# Patient Record
Sex: Male | Born: 1960 | Race: White | Hispanic: No | Marital: Single | Smoking: Former smoker
Health system: Southern US, Community
[De-identification: ages and names within clinical notes are randomized; demographics above are authoritative.]

## PROBLEM LIST (undated history)

## (undated) DIAGNOSIS — B182 Chronic viral hepatitis C: Secondary | ICD-10-CM

## (undated) DIAGNOSIS — C801 Malignant (primary) neoplasm, unspecified: Secondary | ICD-10-CM

## (undated) DIAGNOSIS — T1491XA Suicide attempt, initial encounter: Secondary | ICD-10-CM

## (undated) DIAGNOSIS — F200 Paranoid schizophrenia: Secondary | ICD-10-CM

## (undated) DIAGNOSIS — F319 Bipolar disorder, unspecified: Secondary | ICD-10-CM

## (undated) DIAGNOSIS — C229 Malignant neoplasm of liver, not specified as primary or secondary: Secondary | ICD-10-CM

## (undated) DIAGNOSIS — K7460 Unspecified cirrhosis of liver: Secondary | ICD-10-CM

## (undated) HISTORY — PX: ABDOMINAL SURGERY: SHX537

---

## 2014-02-21 ENCOUNTER — Ambulatory Visit: Payer: Self-pay | Admitting: Gastroenterology

## 2014-05-10 ENCOUNTER — Emergency Department: Payer: Self-pay | Admitting: Emergency Medicine

## 2014-05-10 LAB — COMPREHENSIVE METABOLIC PANEL
ALT: 103 U/L — AB
Albumin: 3.4 g/dL (ref 3.4–5.0)
Alkaline Phosphatase: 77 U/L
Anion Gap: 12 (ref 7–16)
BUN: 11 mg/dL (ref 7–18)
Bilirubin,Total: 1.8 mg/dL — ABNORMAL HIGH (ref 0.2–1.0)
CALCIUM: 8.4 mg/dL — AB (ref 8.5–10.1)
CREATININE: 0.85 mg/dL (ref 0.60–1.30)
Chloride: 107 mmol/L (ref 98–107)
Co2: 19 mmol/L — ABNORMAL LOW (ref 21–32)
EGFR (Non-African Amer.): >60
GLUCOSE: 103 mg/dL — AB (ref 65–99)
OSMOLALITY: 275 (ref 275–301)
Potassium: 3.3 mmol/L — ABNORMAL LOW (ref 3.5–5.1)
SGOT(AST): 218 U/L — ABNORMAL HIGH (ref 15–37)
SODIUM: 138 mmol/L (ref 136–145)
Total Protein: 7.4 g/dL (ref 6.4–8.2)

## 2014-05-10 LAB — URINALYSIS, COMPLETE
BACTERIA: NONE SEEN
Bilirubin,UR: NEGATIVE
Glucose,UR: NEGATIVE mg/dL (ref 0–75)
Hyaline Cast: 31
LEUKOCYTE ESTERASE: NEGATIVE
NITRITE: NEGATIVE
Ph: 6 (ref 4.5–8.0)
Protein: 100
RBC,UR: 5 /HPF (ref 0–5)
SQUAMOUS EPITHELIAL: NONE SEEN
Specific Gravity: 1.029 (ref 1.003–1.030)
WBC UR: 3 /HPF (ref 0–5)

## 2014-05-10 LAB — DRUG SCREEN, URINE
Amphetamines, Ur Screen: NEGATIVE (ref ?–1000)
BENZODIAZEPINE, UR SCRN: NEGATIVE (ref ?–200)
Barbiturates, Ur Screen: NEGATIVE (ref ?–200)
COCAINE METABOLITE, UR ~~LOC~~: NEGATIVE (ref ?–300)
Cannabinoid 50 Ng, Ur ~~LOC~~: NEGATIVE (ref ?–50)
MDMA (Ecstasy)Ur Screen: NEGATIVE (ref ?–500)
METHADONE, UR SCREEN: NEGATIVE (ref ?–300)
OPIATE, UR SCREEN: NEGATIVE (ref ?–300)
Phencyclidine (PCP) Ur S: NEGATIVE (ref ?–25)
Tricyclic, Ur Screen: NEGATIVE (ref ?–1000)

## 2014-05-10 LAB — CBC
HCT: 37.2 % — ABNORMAL LOW (ref 40.0–52.0)
HGB: 12.2 g/dL — ABNORMAL LOW (ref 13.0–18.0)
MCH: 31 pg (ref 26.0–34.0)
MCHC: 32.7 g/dL (ref 32.0–36.0)
MCV: 95 fL (ref 80–100)
Platelet: 67 10*3/uL — ABNORMAL LOW (ref 150–440)
RBC: 3.92 10*6/uL — ABNORMAL LOW (ref 4.40–5.90)
RDW: 16.9 % — AB (ref 11.5–14.5)
WBC: 7 10*3/uL (ref 3.8–10.6)

## 2014-05-10 LAB — ACETAMINOPHEN LEVEL: Acetaminophen: <2 ug/mL

## 2014-05-10 LAB — ETHANOL: Ethanol: <3 mg/dL

## 2014-05-10 LAB — TROPONIN I: Troponin-I: <0.02 ng/mL

## 2014-05-10 LAB — SALICYLATE LEVEL: Salicylates, Serum: <1.7 mg/dL

## 2014-05-10 LAB — LIPASE, BLOOD: Lipase: 330 U/L (ref 73–393)

## 2014-05-10 LAB — AMMONIA: Ammonia, Plasma: 38 mcmol/L — ABNORMAL HIGH (ref 11–32)

## 2014-12-14 NOTE — Consult Note (Signed)
PATIENT NAME:  Aaron Jacobson, Aaron Jacobson MR#:  [id number] DATE OF BIRTH:  Jul 19, 1961  DATE OF CONSULTATION:  05/10/2014  REFERRING PHYSICIAN:   CONSULTING PHYSICIAN:  Unique Sillas K. Naftoli Penny, MD  IDENTIFYING INFORMATION: The patient is a 54 year old white male with long history of schizophrenia. The patient is not employed, not married, and has been living at [facility] group home for quite some time. The patient was brought because he has been noncompliant with medications. In addition, according to information obtained, he laughed at his roommate inside the room because he had arguments with him. In addition, the patient reports that the roommate was upsetting him because he constantly talks about his pain and constantly talks about his p.r.n. medications and really bothers him and he was very upset and angry and behavior was of big concern. The patient reports that he has been off of his medication, which is Zyprexa 10 mg, which was discontinued by his doctor, who is Dr. Jacqualine Code at [facility], because the patient is having hepatitis C and until he is stabilized he will not give the medication. The patient reports that he last had the medication on January 23, 2014. According to information obtained from intake, this was wrong, as given by the PCP.   MENTAL STATUS EXAMINATION: The patient was seen in the hallway, on the 18th. The patient is alert and oriented with prompting and help and reported he did not know the exact date because he does not go to work. Admits feeling upset and angry at his roommate who constantly bothers him about his problems. He does not feel anything wrong in having locked himself up with the roommate. He is irritable and angry and frustrated. Probably he is internally occupied. He is disheveled in appearance with poor grooming and bad odor and malodorous. Denies active suicidal or homicidal ideas, but behavior is unpredictable and could be dangerous to himself and others. Insight and judgment guarded.   IMPRESSION:  Schizophrenia, chronic, paranoid, in exacerbation.  PLAN: He will be admitted to inpatient psychiatry for closer observation, evaluation and help. He is started on Zyprexa 10 mg p.o. at bedtime and medication will be monitored.  ____________________________ Wallace Cullens. Franchot Mimes, MD skc:sb D: 05/10/2014 15:12:16 ET T: 05/10/2014 16:00:14 ET JOB#: [id number]  cc: Arlyn Leak K. Franchot Mimes, MD, <Dictator> Dewain Penning MD ELECTRONICALLY SIGNED 05/11/2014 11:13

## 2014-12-14 NOTE — Consult Note (Signed)
PATIENT NAME:  Aaron Jacobson, Aaron Jacobson MR#:  [id number] DATE OF BIRTH:  [birth year]  DATE OF CONSULTATION:  05/13/2014  REFERRING PHYSICIAN:   CONSULTING PHYSICIAN:  Gonzella Lex, MD  IDENTIFYING INFORMATION AND REASON FOR CONSULT: A 54 year old male with a history of schizophrenia, who came to the Emergency Room after being aggressive at his group home. He was started on treatment 2 days ago.   CHIEF COMPLAINT: "Today I'm feeling so much better."   HISTORY OF PRESENT ILLNESS: Since Friday, he was started on his Zyprexa 10 mg at night. He has been compliant with the medicine. The patient says his mood is feeling better. He is not feeling irritable. He is no longer feeling paranoid. Does not have any irritability or urge to hit someone. He does not have any new physical complaints since then.   REVIEW OF SYSTEMS: A full 9-point review of systems negative. Denies paranoia, denies hallucinations. Denies suicidal or homicidal ideation.   MENTAL STATUS EXAMINATION: Neatly groomed gentleman who looks his stated age, cooperative with the interview. Good eye contact. Normal psychomotor activity. Speech, normal rate, tone and volume. Affect euthymic, pleasant, reactive. Mood stated as okay. Thoughts are lucid without any loosening of associations. No evidence of delusions. Denies auditory or visual hallucinations. Denies suicidal or homicidal ideation. Shows improved judgment and insight. Alert and oriented x 4. Short term memory intact, 3/3 objects immediately and at 2 minutes. Long-term memory intact.   LABORATORY RESULTS: On admission, he did have a drug screen that was negative. He had a low platelet count at 67, low hematocrit and hemoglobin. Elevated ALT at 103, elevated AST at 218, low calcium, elevated bilirubin, elevated ammonia. No further laboratories checked.   ASSESSMENT: A 54 year old man who has hepatitis C and was taken off, by his account, his medicine, and decompensated into paranoia and aggression.  The Zyprexa appears to be a relatively safe medication despite his hepatitis and worthwhile to continue taking. There is a plan from what he says in place for him to get definitive treatment for his hepatitis C soon. Meanwhile, he is tolerating the Zyprexa well and it seems to have helped a great deal. No longer violent or threatening. No longer meets commitment criteria.   TREATMENT PLAN: Discharge back to his group home on his current medication, Zyprexa 10 mg at night. Prescription will be given. Follow up with Dr. Jacqualine Code.   DIAGNOSIS, PRINCIPAL AND PRIMARY:  AXIS I: Schizophrenia.   SECONDARY DIAGNOSES:  AXIS I: No further.  AXIS II: No diagnosis.  AXIS III: Hepatitis C, recent injury to right forearm.   ____________________________ Gonzella Lex, MD jtc:JT D: 05/13/2014 12:21:29 ET T: 05/13/2014 12:46:31 ET JOB#: [id number]  cc: Gonzella Lex, MD, <Dictator> Gonzella Lex MD ELECTRONICALLY SIGNED 05/16/2014 0:11

## 2015-05-17 ENCOUNTER — Emergency Department
Admission: EM | Admit: 2015-05-17 | Discharge: 2015-05-18 | Disposition: A | Discharge status: Home / Self Care | Payer: Medicaid Other | Attending: Emergency Medicine | Admitting: Emergency Medicine

## 2015-05-17 ENCOUNTER — Encounter: Payer: Self-pay | Admitting: Emergency Medicine

## 2015-05-17 DIAGNOSIS — D696 Thrombocytopenia, unspecified: Secondary | ICD-10-CM | POA: Diagnosis not present

## 2015-05-17 DIAGNOSIS — R2240 Localized swelling, mass and lump, unspecified lower limb: Secondary | ICD-10-CM | POA: Diagnosis present

## 2015-05-17 DIAGNOSIS — Z87891 Personal history of nicotine dependence: Secondary | ICD-10-CM | POA: Diagnosis not present

## 2015-05-17 DIAGNOSIS — Z79899 Other long term (current) drug therapy: Secondary | ICD-10-CM | POA: Diagnosis not present

## 2015-05-17 DIAGNOSIS — I868 Varicose veins of other specified sites: Secondary | ICD-10-CM | POA: Insufficient documentation

## 2015-05-17 DIAGNOSIS — I83899 Varicose veins of unspecified lower extremities with other complications: Secondary | ICD-10-CM

## 2015-05-17 DIAGNOSIS — N501 Vascular disorders of male genital organs: Secondary | ICD-10-CM | POA: Insufficient documentation

## 2015-05-17 HISTORY — DX: Unspecified cirrhosis of liver: K74.60

## 2015-05-17 HISTORY — DX: Chronic viral hepatitis C: B18.2

## 2015-05-17 HISTORY — DX: Bipolar disorder, unspecified: F31.9

## 2015-05-17 MED ORDER — SILVER NITRATE-POT NITRATE 75-25 % EX MISC
1.0000 | Freq: Once | CUTANEOUS | Status: AC
  Administered 2015-05-18: 1 via TOPICAL
  Filled 2015-05-17: qty 1

## 2015-05-17 NOTE — ED Provider Notes (Signed)
[facility] Emergency Department Provider Note  ____________________________________________  Time seen: Approximately 11:13 PM  I have reviewed the triage vital signs and the nursing notes.   HISTORY  Chief Complaint Groin Swelling    HPI Aaron Jacobson is a 54 y.o. male who presents to the ED from group home via EMS for a chief complaint of bleeding from his scrotum. Patient has a history of hepatitis C and cirrhosis with prior history of same. States he has "varicose looking veins in the scrotum and they started to bleed tonight". Patient went into the shower and bleeding became heavier. Patient placed a towel against the area and wore a diaper over the towel. Denies other complaints. Denies recent fever, chills, chest pain, shortness of breath, abdominal pain, nausea, vomiting, diarrhea. Denies other free bleeding. Denies use of anticoagulants.   Past Medical History  Diagnosis Date  . Hep C w/ coma, chronic   . Cirrhosis   . Bipolar 1 disorder     There are no active problems to display for this patient.   Past Surgical History  Procedure Laterality Date  . Abdominal surgery      Current Outpatient Rx  Name  Route  Sig  Dispense  Refill  . carvedilol (COREG) 6.25 MG tablet   Oral   Take 6.25 mg by mouth 2 (two) times daily with a meal.         . ibuprofen (ADVIL,MOTRIN) 600 MG tablet   Oral   Take 600 mg by mouth every 6 (six) hours as needed for moderate pain.         Marland Kitchen OLANZapine (ZYPREXA) 10 MG tablet   Oral   Take 10 mg by mouth at bedtime.         Marland Kitchen omeprazole (PRILOSEC) 10 MG capsule   Oral   Take 10 mg by mouth daily.         . ribavirin (COPEGUS) 200 MG tablet   Oral   Take 2 tablets by mouth 2 (two) times daily.         . Sofosbuvir-Velpatasvir 400-100 MG TABS   Oral   Take 1 tablet by mouth daily at 8 pm.           Allergies Review of patient's allergies indicates no known allergies.  History reviewed. No  pertinent family history.  Social History Social History  Substance Use Topics  . Smoking status: Former Research scientist (life sciences)  . Smokeless tobacco: None  . Alcohol Use: No   Former alcohol use  Review of Systems Constitutional: No fever/chills Eyes: No visual changes. ENT: No sore throat. Cardiovascular: Denies chest pain. Respiratory: Denies shortness of breath. Gastrointestinal: No abdominal pain.  No nausea, no vomiting.  No diarrhea.  No constipation. Genitourinary: Positive for scrotal bleeding. Negative for dysuria. Musculoskeletal: Negative for back pain. Skin: Negative for rash. Neurological: Negative for headaches, focal weakness or numbness.  10-point ROS otherwise negative.  ____________________________________________   PHYSICAL EXAM:  VITAL SIGNS: ED Triage Vitals  Enc Vitals Group     BP 05/17/15 2206 116/81 mmHg     Pulse Rate 05/17/15 2206 71     Resp 05/17/15 2206 18     Temp 05/17/15 2206 98.2 F (36.8 C)     Temp Source 05/17/15 2206 Oral     SpO2 05/17/15 2206 97 %     Weight 05/17/15 2206 187 lb (84.823 kg)     Height 05/17/15 2206 6\' 1"  (1.854 m)     Head  Cir --      Peak Flow --      Pain Score 05/17/15 2207 0     Pain Loc --      Pain Edu? --      Excl. in [place]? --     Constitutional: Alert and oriented. Well appearing and in no acute distress. Eyes: Conjunctivae are normal. PERRL. EOMI. Head: Atraumatic. Nose: No congestion/rhinnorhea. Mouth/Throat: Mucous membranes are moist.  Oropharynx non-erythematous. Neck: No stridor.   Cardiovascular: Normal rate, regular rhythm. Grossly normal heart sounds.  Good peripheral circulation. Respiratory: Normal respiratory effort.  No retractions. Lungs CTAB. Gastrointestinal: Soft and nontender. No distention. Ascites noted. No abdominal bruits. No CVA tenderness. Genitourinary: Circumcised male. No active bleeding. There is a tiny abrasion to scrotal sac at the base of the penile shaft associated with tiny  varicose vein. No testicular swelling or tenderness. Strong bilateral cremaster reflexes. Musculoskeletal: No lower extremity tenderness nor edema.  No joint effusions. Neurologic:  Normal speech and language. No gross focal neurologic deficits are appreciated. No gait instability. Skin:  Skin is warm, dry and intact. No rash noted. Psychiatric: Mood and affect are normal. Speech and behavior are normal.  ____________________________________________   LABS (all labs ordered are listed, but only abnormal results are displayed)  Labs Reviewed  CBC WITH DIFFERENTIAL/PLATELET - Abnormal; Notable for the following:    WBC 3.5 (*)    RBC 3.33 (*)    Hemoglobin 11.4 (*)    HCT 33.7 (*)    MCV 101.2 (*)    MCH 34.2 (*)    RDW 18.3 (*)    Platelets 31 (*)    Lymphs Abs 0.8 (*)    All other components within normal limits  COMPREHENSIVE METABOLIC PANEL - Abnormal; Notable for the following:    Chloride 112 (*)    Glucose, Bld 124 (*)    Calcium 8.1 (*)    Total Protein 6.0 (*)    Albumin 2.7 (*)    Total Bilirubin 1.6 (*)    Anion gap 3 (*)    All other components within normal limits  PROTIME-INR - Abnormal; Notable for the following:    Prothrombin Time 17.4 (*)    All other components within normal limits   ____________________________________________  EKG  None ____________________________________________  RADIOLOGY  None ____________________________________________   PROCEDURES  Procedure(s) performed:   Silver nitrate applied to area of abrasion/site of prior bleeding.  Critical Care performed: No  ____________________________________________   INITIAL IMPRESSION / ASSESSMENT AND PLAN / ED COURSE  Pertinent labs & imaging results that were available during my care of the patient were reviewed by me and considered in my medical decision making (see chart for details).  55 year old male with history of cirrhosis with bleeding scrotal varicose vein, now  resolved. Will apply silver nitrate cautery stick. Will check labs including coagulation panels.  ----------------------------------------- 12:08 AM on 05/18/2015 -----------------------------------------  Silver nitrate stick applied. Patient tolerated procedure well. Awaiting lab results.  ----------------------------------------- 12:46 AM on 05/18/2015 -----------------------------------------  Updated patient on laboratory results; thrombocytopenia noted. Platelets were 67 in 04/2014. Reexamined scrotum; no active bleeding. No indication to replace platelets at this time. Advised scrotal elevation. Follow-up with PCP. Strict return precautions given. Patient verbalizes understanding and agrees with plan of care. ____________________________________________   FINAL CLINICAL IMPRESSION(S) / ED DIAGNOSES  Final diagnoses:  Scrotal bleeding  Bleeding from varicose vein, unspecified laterality  Thrombocytopenia      Paulette Blanch, MD 05/18/15 [phone]

## 2015-05-17 NOTE — ED Notes (Signed)
Silver Nitrate Applicators placed at bedside.

## 2015-05-17 NOTE — ED Notes (Signed)
Pt arrived to the ED from group home via EMS for bleeding from the scrotum. Pt reports that he has "varicose looking veins in the scrotum and they started to bleed while showering." Pt is AOx4 in no apparent distress.

## 2015-05-18 LAB — CBC WITH DIFFERENTIAL/PLATELET
Basophils Absolute: 0 10*3/uL (ref 0–0.1)
Basophils Relative: 0 %
Eosinophils Absolute: 0.3 10*3/uL (ref 0–0.7)
HEMATOCRIT: 33.7 % — AB (ref 40.0–52.0)
Hemoglobin: 11.4 g/dL — ABNORMAL LOW (ref 13.0–18.0)
Lymphs Abs: 0.8 10*3/uL — ABNORMAL LOW (ref 1.0–3.6)
MCH: 34.2 pg — AB (ref 26.0–34.0)
MCHC: 33.8 g/dL (ref 32.0–36.0)
MCV: 101.2 fL — AB (ref 80.0–100.0)
MONO ABS: 0.4 10*3/uL (ref 0.2–1.0)
NEUTROS ABS: 2 10*3/uL (ref 1.4–6.5)
Neutrophils Relative %: 57 %
PLATELETS: 31 10*3/uL — AB (ref 150–440)
RBC: 3.33 MIL/uL — ABNORMAL LOW (ref 4.40–5.90)
RDW: 18.3 % — AB (ref 11.5–14.5)
WBC: 3.5 10*3/uL — ABNORMAL LOW (ref 3.8–10.6)

## 2015-05-18 LAB — COMPREHENSIVE METABOLIC PANEL
ALT: 19 U/L (ref 17–63)
ANION GAP: 3 — AB (ref 5–15)
AST: 39 U/L (ref 15–41)
Albumin: 2.7 g/dL — ABNORMAL LOW (ref 3.5–5.0)
Alkaline Phosphatase: 101 U/L (ref 38–126)
BILIRUBIN TOTAL: 1.6 mg/dL — AB (ref 0.3–1.2)
BUN: 7 mg/dL (ref 6–20)
CHLORIDE: 112 mmol/L — AB (ref 101–111)
CO2: 23 mmol/L (ref 22–32)
Calcium: 8.1 mg/dL — ABNORMAL LOW (ref 8.9–10.3)
Creatinine, Ser: 0.7 mg/dL (ref 0.61–1.24)
Glucose, Bld: 124 mg/dL — ABNORMAL HIGH (ref 65–99)
POTASSIUM: 3.8 mmol/L (ref 3.5–5.1)
Sodium: 138 mmol/L (ref 135–145)
TOTAL PROTEIN: 6 g/dL — AB (ref 6.5–8.1)

## 2015-05-18 LAB — PROTIME-INR
INR: 1.4
PROTHROMBIN TIME: 17.4 s — AB (ref 11.4–15.0)

## 2015-05-18 NOTE — Discharge Instructions (Signed)
1. Stay reclined as much as possible and elevate scrotum. 2. Follow-up with your doctor to recheck platelet count. 3. Return to the ER for recurrent or worsening symptoms, persistent vomiting, pain or swelling in testicles, or other concerns.  Bleeding Varicose Veins Varicose veins are veins that have become enlarged and twisted. Valves in the veins help return blood from the leg to the heart. If these valves are damaged, blood flows backwards and backs up into the veins in the leg near the skin. This causes the veins to become larger because of increased pressure within. Sometimes these veins bleed. CAUSES  Factors that can lead to bleeding varicose veins include:  Thinning of the skin that covers the veins. This skin is stretched as the veins enlarge.  Weak and thinning walls of the varicose veins. These thin walls are part of the reason why blood is not flowing normally to the heart.  Having high pressure in the veins. This high pressure occurs because the blood is not flowing freely back up to the heart.  Injury. Even a small injury to a varicose vein can cause bleeding.  Open wounds. A sore may develop near a varicose vein and not heal. This makes bleeding more likely.  Taking medicine that thins the blood. These medicines may include aspirin, anti-inflammatory medicine, and other blood thinners. SYMPTOMS  If bleeding is on the outside surface of the skin, blood can be seen. Sometimes, the bleeding stays under the skin. If this happens, the blue or purple area will spread beyond the vein. This discoloration may be visible. DIAGNOSIS  To decide if you have a bleeding varicose vein, your caregiver may:  Ask about your symptoms. This will include when you first saw bleeding.  Ask about how long you have had varicose veins and if they cause you problems.  Ask about your overall health.  Ask about possible causes, like recent cuts or if the area near the varicose veins was bumped or  injured.  Examine the skin or leg that concerns you. Your caregiver will probably feel the veins.  Order imaging tests. These create detailed pictures of the veins. TREATMENT  The first goal of treating bleeding varicose veins is to stop the bleeding. Then, the aim is to keep any bleeding from happening again. Treatment will depend on the cause of the bleeding and how bad it is. Ask your caregiver about what would be best for you. Options include:  Raising (elevating) your leg. Lie down with your leg propped up on a pillow or cushion. Your foot should be above your heart.  Applying pressure to the spot that is bleeding. The bleeding should stop in a short time.  Wearing elastic stocking that "compress" your legs (compression stockings). An elastic bandage may do the same thing.  Applying an antibiotic cream on sores that are not healing.  Surgically removing or closing off the bleeding varicose veins. HOME CARE INSTRUCTIONS   Apply any creams that your caregiver prescribed. Follow the directions carefully.  Wear compression stockings or any special wraps that were prescribed. Make sure you know:  If you should wear them every day.  How long you should wear them.  If veins were removed or closed, a bandage (dressing) will probably cover the area. Make sure you know:  How often the dressing should be changed.  Whether the area can get wet.  When you can leave the skin uncovered.  Check your skin every day. Look for new sores and signs of  bleeding.  To prevent future bleeding:  Use extra care in situations where you could cut your legs. Shaving, for example, or working outside in the garden.  Try to keep your legs elevated as much as possible. Lie down when you can. SEEK MEDICAL CARE IF:   You have any questions about how to wear compression stockings or elastic bandages.  Your veins continue to bleed.  Sores develop near your varicose veins.  You have a sore that does  not heal or gets bigger.  Pain increases in your leg.  The area around a varicose vein becomes warm, red, or tender to the touch.  You notice a yellowish fluid that smells bad coming from a spot where there was bleeding.  You develop a fever of more than 100.5 F (38.1 C). SEEK IMMEDIATE MEDICAL CARE IF:   You develop a fever of more than 102 F (38.9 C). Document Released: 12/26/2008 Document Revised: 11/01/2011 Document Reviewed: 12/11/2013 [facility] Patient Information 2015 [place], [place]. This information is not intended to replace advice given to you by your health care provider. Make sure you discuss any questions you have with your health care provider.  Thrombocytopenia Thrombocytopenia is a condition in which there is an abnormally small number of platelets in your blood. Platelets are also called thrombocytes. Platelets are needed for blood clotting. CAUSES Thrombocytopenia is caused by:   Decreased production of platelets. This can be caused by:  Aplastic anemia in which your bone marrow quits making blood cells.  Cancer in the bone marrow.  Use of certain medicines, including chemotherapy.  Infection in the bone marrow.  Heavy alcohol consumption.  Increased destruction of platelets. This can be caused by:  Certain immune diseases.  Use of certain drugs.  Certain blood clotting disorders.  Certain inherited disorders.  Certain bleeding disorders.  Pregnancy.  Having an enlarged spleen (hypersplenism). In hypersplenism, the spleen gathers up platelets from circulation. This means the platelets are not available to help with blood clotting. The spleen can enlarge due to cirrhosis or other conditions. SYMPTOMS  The symptoms of thrombocytopenia are side effects of poor blood clotting. Some of these are:  Abnormal bleeding.  Nosebleeds.  Heavy menstrual periods.  Blood in the urine or stools.  Purpura. This is a purplish discoloration in the skin  produced by small bleeding vessels near the surface of the skin.  Bruising.  A rash that may be petechial. This looks like pinpoint, purplish-red spots on the skin and mucous membranes. It is caused by bleeding from small blood vessels (capillaries). DIAGNOSIS  Your caregiver will make this diagnosis based on your exam and blood tests. Sometimes, a bone marrow study is done to look for the original cells (megakaryocytes) that make platelets. TREATMENT  Treatment depends on the cause of the condition.  Medicines may be given to help protect your platelets from being destroyed.  In some cases, a replacement (transfusion) of platelets may be required to stop or prevent bleeding.  Sometimes, the spleen must be surgically removed. HOME CARE INSTRUCTIONS   Check the skin and linings inside your mouth for bruising or bleeding as directed by your caregiver.  Check your sputum, urine, and stool for blood as directed by your caregiver.  Do not return to any activities that could cause bumps or bruises until your caregiver says it is okay.  Take extra care not to cut yourself when shaving or when using scissors, needles, knives, and other tools.  Take extra care not to burn  yourself when ironing or cooking.  Ask your caregiver if it is okay for you to drink alcohol.  Only take over-the-counter or prescription medicines as directed by your caregiver.  Notify all your caregivers, including dentists and eye doctors, about your condition. SEEK IMMEDIATE MEDICAL CARE IF:   You develop active bleeding from anywhere in your body.  You develop unexplained bruising or bleeding.  You have blood in your sputum, urine, or stool. MAKE SURE YOU:  Understand these instructions.  Will watch your condition.  Will get help right away if you are not doing well or get worse. Document Released: 08/09/2005 Document Revised: 11/01/2011 Document Reviewed: 06/11/2011 [facility] Patient Information 2015  [place] , [place]. This information is not intended to replace advice given to you by your health care provider. Make sure you discuss any questions you have with your health care provider.

## 2016-04-05 ENCOUNTER — Inpatient Hospital Stay
Admission: EM | Admit: 2016-04-05 | Discharge: 2016-04-07 | DRG: 918 | Disposition: A | Discharge status: Rehabilitation Facility | Payer: Medicaid Other | Attending: Internal Medicine | Admitting: Internal Medicine

## 2016-04-05 DIAGNOSIS — R9431 Abnormal electrocardiogram [ECG] [EKG]: Secondary | ICD-10-CM

## 2016-04-05 DIAGNOSIS — T447X2A Poisoning by beta-adrenoreceptor antagonists, intentional self-harm, initial encounter: Principal | ICD-10-CM | POA: Diagnosis present

## 2016-04-05 DIAGNOSIS — B182 Chronic viral hepatitis C: Secondary | ICD-10-CM | POA: Diagnosis present

## 2016-04-05 DIAGNOSIS — Z9889 Other specified postprocedural states: Secondary | ICD-10-CM

## 2016-04-05 DIAGNOSIS — Z79899 Other long term (current) drug therapy: Secondary | ICD-10-CM | POA: Diagnosis not present

## 2016-04-05 DIAGNOSIS — I77819 Aortic ectasia, unspecified site: Secondary | ICD-10-CM | POA: Diagnosis present

## 2016-04-05 DIAGNOSIS — F32A Depression, unspecified: Secondary | ICD-10-CM | POA: Diagnosis present

## 2016-04-05 DIAGNOSIS — E876 Hypokalemia: Secondary | ICD-10-CM | POA: Diagnosis present

## 2016-04-05 DIAGNOSIS — T1491XA Suicide attempt, initial encounter: Secondary | ICD-10-CM

## 2016-04-05 DIAGNOSIS — R601 Generalized edema: Secondary | ICD-10-CM | POA: Diagnosis present

## 2016-04-05 DIAGNOSIS — F329 Major depressive disorder, single episode, unspecified: Secondary | ICD-10-CM | POA: Diagnosis not present

## 2016-04-05 DIAGNOSIS — T50902A Poisoning by unspecified drugs, medicaments and biological substances, intentional self-harm, initial encounter: Secondary | ICD-10-CM

## 2016-04-05 DIAGNOSIS — Z720 Tobacco use: Secondary | ICD-10-CM

## 2016-04-05 DIAGNOSIS — K766 Portal hypertension: Secondary | ICD-10-CM | POA: Diagnosis present

## 2016-04-05 DIAGNOSIS — D61818 Other pancytopenia: Secondary | ICD-10-CM | POA: Diagnosis present

## 2016-04-05 DIAGNOSIS — I8500 Esophageal varices without bleeding: Secondary | ICD-10-CM | POA: Diagnosis present

## 2016-04-05 DIAGNOSIS — R001 Bradycardia, unspecified: Secondary | ICD-10-CM | POA: Diagnosis present

## 2016-04-05 DIAGNOSIS — F319 Bipolar disorder, unspecified: Secondary | ICD-10-CM | POA: Diagnosis present

## 2016-04-05 DIAGNOSIS — R579 Shock, unspecified: Secondary | ICD-10-CM

## 2016-04-05 DIAGNOSIS — I959 Hypotension, unspecified: Secondary | ICD-10-CM | POA: Diagnosis present

## 2016-04-05 DIAGNOSIS — F200 Paranoid schizophrenia: Secondary | ICD-10-CM

## 2016-04-05 DIAGNOSIS — K7460 Unspecified cirrhosis of liver: Secondary | ICD-10-CM | POA: Diagnosis present

## 2016-04-05 DIAGNOSIS — C229 Malignant neoplasm of liver, not specified as primary or secondary: Secondary | ICD-10-CM | POA: Diagnosis present

## 2016-04-05 DIAGNOSIS — I10 Essential (primary) hypertension: Secondary | ICD-10-CM | POA: Diagnosis present

## 2016-04-05 DIAGNOSIS — B1920 Unspecified viral hepatitis C without hepatic coma: Secondary | ICD-10-CM | POA: Diagnosis not present

## 2016-04-05 DIAGNOSIS — R188 Other ascites: Secondary | ICD-10-CM

## 2016-04-05 DIAGNOSIS — C220 Liver cell carcinoma: Secondary | ICD-10-CM | POA: Diagnosis present

## 2016-04-05 DIAGNOSIS — I9589 Other hypotension: Secondary | ICD-10-CM | POA: Diagnosis not present

## 2016-04-05 HISTORY — DX: Suicide attempt, initial encounter: T14.91XA

## 2016-04-05 HISTORY — DX: Paranoid schizophrenia: F20.0

## 2016-04-05 LAB — CBC
HCT: 28.2 % — ABNORMAL LOW (ref 40.0–52.0)
HEMOGLOBIN: 9.4 g/dL — AB (ref 13.0–18.0)
MCH: 30.7 pg (ref 26.0–34.0)
MCHC: 33.3 g/dL (ref 32.0–36.0)
MCV: 92.1 fL (ref 80.0–100.0)
Platelets: 33 10*3/uL — ABNORMAL LOW (ref 150–440)
RBC: 3.06 MIL/uL — AB (ref 4.40–5.90)
RDW: 18.8 % — ABNORMAL HIGH (ref 11.5–14.5)
WBC: 2.2 10*3/uL — AB (ref 3.8–10.6)

## 2016-04-05 LAB — COMPREHENSIVE METABOLIC PANEL
ALK PHOS: 96 U/L (ref 38–126)
ALT: 19 U/L (ref 17–63)
AST: 40 U/L (ref 15–41)
Albumin: 2.9 g/dL — ABNORMAL LOW (ref 3.5–5.0)
Anion gap: 5 (ref 5–15)
BUN: 7 mg/dL (ref 6–20)
CALCIUM: 8 mg/dL — AB (ref 8.9–10.3)
CO2: 21 mmol/L — AB (ref 22–32)
CREATININE: 0.58 mg/dL — AB (ref 0.61–1.24)
Chloride: 109 mmol/L (ref 101–111)
Glucose, Bld: 131 mg/dL — ABNORMAL HIGH (ref 65–99)
Potassium: 3.2 mmol/L — ABNORMAL LOW (ref 3.5–5.1)
SODIUM: 135 mmol/L (ref 135–145)
Total Bilirubin: 0.8 mg/dL (ref 0.3–1.2)
Total Protein: 5.7 g/dL — ABNORMAL LOW (ref 6.5–8.1)

## 2016-04-05 LAB — TROPONIN I: Troponin I: <0.03 ng/mL (ref <0.03)

## 2016-04-05 MED ORDER — CHARCOAL ACTIVATED PO LIQD
50.0000 g | Freq: Once | ORAL | Status: AC
  Administered 2016-04-05: 50 g via ORAL
  Filled 2016-04-05: qty 240

## 2016-04-05 MED ORDER — SODIUM CHLORIDE 0.9 % IV BOLUS (SEPSIS)
1000.0000 mL | Freq: Once | INTRAVENOUS | Status: AC
  Administered 2016-04-05: 1000 mL via INTRAVENOUS

## 2016-04-05 MED ORDER — GLUCAGON HCL RDNA (DIAGNOSTIC) 1 MG IJ SOLR
1.0000 mg | Freq: Once | INTRAMUSCULAR | Status: AC
  Administered 2016-04-05: 1 mg via INTRAVENOUS
  Filled 2016-04-05: qty 1

## 2016-04-05 MED ORDER — ONDANSETRON HCL 4 MG/2ML IJ SOLN
4.0000 mg | Freq: Once | INTRAMUSCULAR | Status: AC
  Administered 2016-04-05: 4 mg via INTRAVENOUS
  Filled 2016-04-05: qty 2

## 2016-04-05 MED ORDER — CHARCOAL ACTIVATED PO LIQD
ORAL | Status: AC
  Filled 2016-04-05: qty 240

## 2016-04-05 MED ORDER — SODIUM CHLORIDE 0.9 % IV SOLN
1.0000 g | Freq: Once | INTRAVENOUS | Status: AC
  Administered 2016-04-06: 1 g via INTRAVENOUS
  Filled 2016-04-05: qty 10

## 2016-04-05 NOTE — ED Provider Notes (Signed)
[facility] Emergency Department Provider Note    First MD Initiated Contact with Patient 04/05/16 2222     (approximate)  I have reviewed the triage vital signs and the nursing notes.   HISTORY  Chief Complaint Drug Overdose and Depression    HPI Aaron Jacobson is a 55 y.o. male history of bipolar disorder as well as cirrhosis presents with intentional ingestion of over 40 - 6.25 mg carvedilol pills roughly 1 hour prior to arrival. There is a slightly diagnosed with cancer and given his cirrhosis she's been very depressed and no longer wanted to live. States even thinking about killing himself quite some time. Said he isn't taking his blood pressure medications tonight given his life. States that after taking him he laid in bed for 30 minutes and then regretted taking the medications at which point he called EMS. After EMS arrived the patient was hypotensive without tachycardia. Patient otherwise mentating appropriately. Patient denies any other polysubstance abuse tonight.   Past Medical History:  Diagnosis Date  . Bipolar 1 disorder ([place])   . Cirrhosis ([place])   . Hep C w/ coma, chronic (HCC)     There are no active problems to display for this patient.   Past Surgical History:  Procedure Laterality Date  . ABDOMINAL SURGERY      Prior to Admission medications   Medication Sig Start Date End Date Taking? Authorizing Provider  carvedilol (COREG) 6.25 MG tablet Take 6.25 mg by mouth 2 (two) times daily with a meal.    Historical Provider, MD  ibuprofen (ADVIL,MOTRIN) 600 MG tablet Take 600 mg by mouth every 6 (six) hours as needed for moderate pain.    Historical Provider, MD  OLANZapine (ZYPREXA) 10 MG tablet Take 10 mg by mouth at bedtime.    Historical Provider, MD  omeprazole (PRILOSEC) 10 MG capsule Take 10 mg by mouth daily.    Historical Provider, MD  ribavirin (COPEGUS) 200 MG tablet Take 2 tablets by mouth 2 (two) times daily. 03/07/15    Historical Provider, MD  Sofosbuvir-Velpatasvir 400-100 MG TABS Take 1 tablet by mouth daily at 8 pm. 03/07/15   Historical Provider, MD    Allergies Review of patient's allergies indicates no known allergies.  No family history on file.  Social History Social History  Substance Use Topics  . Smoking status: Former Research scientist (life sciences)  . Smokeless tobacco: Never Used  . Alcohol use No    Review of Systems Patient denies headaches, rhinorrhea, blurry vision, numbness, shortness of breath, chest pain, edema, cough, abdominal pain, nausea, vomiting, diarrhea, dysuria, fevers, rashes or hallucinations unless otherwise stated above in HPI. ____________________________________________   PHYSICAL EXAM:  VITAL SIGNS: Vitals:   04/05/16 2331 04/06/16 0000  BP: 95/66 (!) 95/59  Pulse: 70 72  Resp: (!) 21 (!) 22  Temp:      Constitutional: Alert and oriented. Patient appears older than stated age. Eyes: Conjunctivae are normal. 5mm pupils bilaterally. EOMI. Head: Atraumatic. Nose: No congestion/rhinnorhea. Mouth/Throat: Mucous membranes are moist.  Oropharynx non-erythematous. Neck: No stridor. Painless ROM. No cervical spine tenderness to palpation Hematological/Lymphatic/Immunilogical: No cervical lymphadenopathy. Cardiovascular: Normal rate, regular rhythm. Grossly normal heart sounds.  1+ pulses in all four extremities Respiratory: Normal respiratory effort.  No retractions. Lungs CTAB. Gastrointestinal: Soft and nontender. No distention. No abdominal bruits. No CVA tenderness. Musculoskeletal: No lower extremity tenderness nor edema.  No joint effusions. Neurologic:  Normal speech and language. No gross focal neurologic deficits are appreciated. No gait  instability. Skin:  Skin is cool, dry and intact. No rash noted.  ____________________________________________   LABS (all labs ordered are listed, but only abnormal results are displayed)  Results for orders placed or performed during  the hospital encounter of 04/05/16 (from the past 24 hour(s))  CBC     Status: Abnormal   Collection Time: 04/05/16 10:29 PM  Result Value Ref Range   WBC 2.2 (L) 3.8 - 10.6 K/uL   RBC 3.06 (L) 4.40 - 5.90 MIL/uL   Hemoglobin 9.4 (L) 13.0 - 18.0 g/dL   HCT 28.2 (L) 40.0 - 52.0 %   MCV 92.1 80.0 - 100.0 fL   MCH 30.7 26.0 - 34.0 pg   MCHC 33.3 32.0 - 36.0 g/dL   RDW 18.8 (H) 11.5 - 14.5 %   Platelets 33 (L) 150 - 440 K/uL  Comprehensive metabolic panel     Status: Abnormal   Collection Time: 04/05/16 10:29 PM  Result Value Ref Range   Sodium 135 135 - 145 mmol/L   Potassium 3.2 (L) 3.5 - 5.1 mmol/L   Chloride 109 101 - 111 mmol/L   CO2 21 (L) 22 - 32 mmol/L   Glucose, Bld 131 (H) 65 - 99 mg/dL   BUN 7 6 - 20 mg/dL   Creatinine, Ser 0.58 (L) 0.61 - 1.24 mg/dL   Calcium 8.0 (L) 8.9 - 10.3 mg/dL   Total Protein 5.7 (L) 6.5 - 8.1 g/dL   Albumin 2.9 (L) 3.5 - 5.0 g/dL   AST 40 15 - 41 U/L   ALT 19 17 - 63 U/L   Alkaline Phosphatase 96 38 - 126 U/L   Total Bilirubin 0.8 0.3 - 1.2 mg/dL   GFR calc non Af Amer >60 >60 mL/min   GFR calc Af Amer >60 >60 mL/min   Anion gap 5 5 - 15  Troponin I     Status: None   Collection Time: 04/05/16 10:29 PM  Result Value Ref Range   Troponin I <0.03 <0.03 ng/mL   ____________________________________________  EKG My interpretation at Time: 22:28   Indication: hypotension  Rate: 70  Rhythm: nsr Axis: normal Other: nonspecific ST changes. ____________________________________________  [id number] ____________________________________________   PROCEDURES  Procedure(s) performed: none    Critical Care performed: yes CRITICAL CARE Performed by: Merlyn Lot   Total critical care time: 50 minutes  Critical care time was exclusive of separately billable procedures and treating other patients.  Critical care was necessary to treat or prevent imminent or life-threatening deterioration.  Critical care was time spent personally by me  on the following activities: development of treatment plan with patient and/or surrogate as well as nursing, discussions with consultants, evaluation of patient's response to treatment, examination of patient, obtaining history from patient or surrogate, ordering and performing treatments and interventions, ordering and review of laboratory studies, ordering and review of radiographic studies, pulse oximetry and re-evaluation of patient's condition.  ____________________________________________   INITIAL IMPRESSION / ASSESSMENT AND PLAN / ED COURSE  Pertinent labs & imaging results that were available during my care of the patient were reviewed by me and considered in my medical decision making (see chart for details).  DDX: Suicide attempt, unintentional overdose, heart failure, shock, Tylenol overdose  Ferrin Creach is a 55 y.o. who presents to the ED with acute hypotension status post intentional overdose on his home carvedilol. Patient maintaining his airway and mentating appropriately but is critically ill. IV access obtained immediately upon arrival to the ER and patient given  2 L of IV fluid resuscitation with improvement in his blood pressure. Patient denies any other substance abuse. His abdominal exam is soft and benign.  Clinical Course  Comment By Time  Repeat assessment he had tolerated activated charcoal. Remains hypotensive with a heart rate of 62 but is appropriately mentating and protecting his airway. I have paged intensivist for admission. Merlyn Lot, MD 08/14 2316   ----------------------------------------- 12:01 AM on 04/06/2016 -----------------------------------------  No further request by the intensivist. I spoke with the hospitalist Dr. Ara Kussmaul regarding admission to the ICU. Patient continues to be mentating appropriately but with borderline low blood pressures and inappropriate bradycardia. Will continue IV fluid resuscitation as well as glucagon as needed.  Atropine as at bedside. No pressors needed at this time. Patient remains critically ill and will need admission to the ICU.  She has a IVC due to his intentional overdose.  Have discussed with the patient and available family all diagnostics and treatments performed thus far and all questions were answered to the best of my ability. The patient demonstrates understanding and agreement with plan.   ____________________________________________   FINAL CLINICAL IMPRESSION(S) / ED DIAGNOSES  Final diagnoses:  Intentional drug overdose, initial encounter ([place])  Shock ([place])  Suicide attempt ([place])      NEW MEDICATIONS STARTED DURING THIS VISIT:  New Prescriptions   No medications on file     Note:  This document was prepared using Dragon voice recognition software and may include unintentional dictation errors.    Merlyn Lot, MD 04/06/16 0005

## 2016-04-05 NOTE — ED Triage Notes (Signed)
Pt arrives to ED via [facility] with reports of an intentional OD by taking 40-50 25mg  Labetalol tablets about 90 minutes PTA. Pt reports having liver cancer and not wanting to do chemotherapy. Pt states he took the tablets, regretted it, and called 911. EMS reports giving 320mL of LR via PIV. Pt arrives A&O, in NAD, HYPOtensive, but answering questions and acting appropriately.

## 2016-04-06 ENCOUNTER — Encounter: Payer: Self-pay | Admitting: *Deleted

## 2016-04-06 DIAGNOSIS — F200 Paranoid schizophrenia: Secondary | ICD-10-CM

## 2016-04-06 DIAGNOSIS — B1920 Unspecified viral hepatitis C without hepatic coma: Secondary | ICD-10-CM

## 2016-04-06 DIAGNOSIS — R001 Bradycardia, unspecified: Secondary | ICD-10-CM

## 2016-04-06 DIAGNOSIS — I4581 Long QT syndrome: Secondary | ICD-10-CM

## 2016-04-06 DIAGNOSIS — D696 Thrombocytopenia, unspecified: Secondary | ICD-10-CM

## 2016-04-06 DIAGNOSIS — I9589 Other hypotension: Secondary | ICD-10-CM

## 2016-04-06 DIAGNOSIS — F329 Major depressive disorder, single episode, unspecified: Secondary | ICD-10-CM

## 2016-04-06 DIAGNOSIS — C229 Malignant neoplasm of liver, not specified as primary or secondary: Secondary | ICD-10-CM | POA: Diagnosis present

## 2016-04-06 DIAGNOSIS — T447X2A Poisoning by beta-adrenoreceptor antagonists, intentional self-harm, initial encounter: Principal | ICD-10-CM

## 2016-04-06 DIAGNOSIS — F32A Depression, unspecified: Secondary | ICD-10-CM | POA: Diagnosis present

## 2016-04-06 DIAGNOSIS — I959 Hypotension, unspecified: Secondary | ICD-10-CM | POA: Diagnosis present

## 2016-04-06 DIAGNOSIS — F319 Bipolar disorder, unspecified: Secondary | ICD-10-CM

## 2016-04-06 DIAGNOSIS — C220 Liver cell carcinoma: Secondary | ICD-10-CM

## 2016-04-06 DIAGNOSIS — R9431 Abnormal electrocardiogram [ECG] [EKG]: Secondary | ICD-10-CM

## 2016-04-06 LAB — BASIC METABOLIC PANEL
Anion gap: 5 (ref 5–15)
BUN: 6 mg/dL (ref 6–20)
CALCIUM: 7.8 mg/dL — AB (ref 8.9–10.3)
CO2: 21 mmol/L — ABNORMAL LOW (ref 22–32)
Chloride: 115 mmol/L — ABNORMAL HIGH (ref 101–111)
Creatinine, Ser: 0.49 mg/dL — ABNORMAL LOW (ref 0.61–1.24)
GFR calc Af Amer: >60 mL/min (ref >60)
GLUCOSE: 112 mg/dL — AB (ref 65–99)
Potassium: 3.6 mmol/L (ref 3.5–5.1)
SODIUM: 141 mmol/L (ref 135–145)

## 2016-04-06 LAB — CBC
HCT: 29.5 % — ABNORMAL LOW (ref 40.0–52.0)
Hemoglobin: 9.9 g/dL — ABNORMAL LOW (ref 13.0–18.0)
MCH: 31.1 pg (ref 26.0–34.0)
MCHC: 33.6 g/dL (ref 32.0–36.0)
MCV: 92.6 fL (ref 80.0–100.0)
PLATELETS: 27 10*3/uL — AB (ref 150–440)
RBC: 3.18 MIL/uL — ABNORMAL LOW (ref 4.40–5.90)
RDW: 18.7 % — AB (ref 11.5–14.5)
WBC: 1.9 10*3/uL — AB (ref 3.8–10.6)

## 2016-04-06 LAB — SALICYLATE LEVEL: Salicylate Lvl: <4 mg/dL (ref 2.8–30.0)

## 2016-04-06 LAB — MAGNESIUM: MAGNESIUM: 1.9 mg/dL (ref 1.7–2.4)

## 2016-04-06 LAB — MRSA PCR SCREENING: MRSA by PCR: NEGATIVE

## 2016-04-06 LAB — ACETAMINOPHEN LEVEL: Acetaminophen (Tylenol), Serum: <10 ug/mL — ABNORMAL LOW (ref 10–30)

## 2016-04-06 LAB — PHOSPHORUS: Phosphorus: 4 mg/dL (ref 2.5–4.6)

## 2016-04-06 MED ORDER — RIBAVIRIN 200 MG PO CAPS
400.0000 mg | ORAL_CAPSULE | Freq: Two times a day (BID) | ORAL | Status: DC
  Filled 2016-04-06 (×2): qty 2

## 2016-04-06 MED ORDER — SOFOSBUVIR-VELPATASVIR 400-100 MG PO TABS: 1.0000 | ORAL_TABLET | Freq: Every day | ORAL | Status: DC

## 2016-04-06 MED ORDER — POTASSIUM CHLORIDE 20 MEQ PO PACK
20.0000 meq | PACK | Freq: Two times a day (BID) | ORAL | Status: AC
  Administered 2016-04-06 – 2016-04-07 (×4): 20 meq via ORAL
  Filled 2016-04-06 (×4): qty 1

## 2016-04-06 MED ORDER — PANTOPRAZOLE SODIUM 40 MG PO TBEC
40.0000 mg | DELAYED_RELEASE_TABLET | Freq: Every day | ORAL | Status: DC
  Administered 2016-04-06 – 2016-04-07 (×2): 40 mg via ORAL
  Filled 2016-04-06 (×2): qty 1

## 2016-04-06 MED ORDER — ONDANSETRON HCL 4 MG/2ML IJ SOLN: 4.0000 mg | Freq: Four times a day (QID) | INTRAMUSCULAR | Status: DC | PRN

## 2016-04-06 MED ORDER — SODIUM CHLORIDE 0.9 % IV SOLN
INTRAVENOUS | Status: DC
  Administered 2016-04-06: 02:00:00 via INTRAVENOUS

## 2016-04-06 MED ORDER — SODIUM CHLORIDE 0.9 % IV BOLUS (SEPSIS)
1000.0000 mL | Freq: Once | INTRAVENOUS | Status: AC
  Administered 2016-04-06: 1000 mL via INTRAVENOUS

## 2016-04-06 MED ORDER — OLANZAPINE 10 MG PO TABS
10.0000 mg | ORAL_TABLET | Freq: Every day | ORAL | Status: DC
  Administered 2016-04-06: 10 mg via ORAL
  Filled 2016-04-06: qty 1

## 2016-04-06 NOTE — Consult Note (Signed)
[facility] Face-to-Face Psychiatry Consult   Reason for Consult:  Consult for this 55 year old man with a history of schizophrenia currently in the hospital after an attempted suicide by overdose Referring Physician:  Posey Pronto Patient Identification: Aaron Jacobson MRN:  [id number] Principal Diagnosis: Paranoid schizophrenia [facility]) Diagnosis:   Patient Active Problem List   Diagnosis Date Noted  . Hypotension [I95.9] 04/06/2016  . Suicide attempt by beta blocker overdose ([place]) [T44.7X2A] 04/06/2016  . Depression [F32.9] 04/06/2016  . Liver cancer ([place]) [C22.9] 04/06/2016  . Bipolar 1 disorder ([place]) [F31.9] 04/06/2016  . Hepatitis C [B19.20] 04/06/2016  . Paranoid schizophrenia ([place]) [F20.0] 04/06/2016  . Bradycardia [R00.1]   . Prolonged Q-T interval on ECG [I45.81]     Total Time spent with patient: 1 hour  Subjective:   Aaron Jacobson is a 55 y.o. male patient admitted with "I just got so depressed I thought I would end it".  HPI:  Patient interviewed. Chart reviewed. Labs and vitals reviewed. 56 year old man with a history of schizophrenia came into the hospital after overdosing on his own blood pressure medicine. Patient says that he had been having suicidal thoughts for the last several days. His mood had been feeling down and negative but just about for the last week. He is particularly stressed out about his new diagnosis of cancer and the need to make a choice about treatment. He tells me that he had been feeling under a lot of pressure from friends and family and care providers to take the offered chemotherapy and radiation therapy for his cancer but he is very ambivalent about doing it. He says that he finally reached a point where he decided that rather than go through the whole painful experience one way or another he would just kill himself. He says he took about 40 of his beta blocker blood pressure medicines and then waited a few hours assuming that he would die. It wasn't until hours later  when he was not dead but was feeling sick that he called 911. He says he still having some suicidal thoughts but doesn't have any intent or plan to act on it. He went to some length to describe to me the recent ups and downs of his medical care and his thinking around suicide. It's actually all based on facts and reasonable assumptions and believes. Doesn't appear to be psychotic at all. Hasn't been drinking or using any drugs. 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  Medical history: Patient has a history of hepatitis C new diagnosis of liver cancer liver cirrhosis. He is now recovering from overdose of beta blockers. Has a prolonged QT on his EKG.  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 Drink alcohol.  Social history: Patient lives by himself in an apartment. He does have some relatives siblings and a mother he is close with. Patient is on probation which is a big stress for him.  Past Psychiatric History: Long history of schizophrenia diagnosed years ago when he was in prison. Has been stable on olanzapine for years. Prior to this one event he had had a suicide attempt at his first diagnosis which by his report was probably 30 years ago. No other suicide attempts since then. He's been on Zyprexa for years  with good stability.  Risk to Self: Is patient at risk for suicide?: Yes Risk to Others:   Prior Inpatient Therapy:   Prior Outpatient Therapy:    Past Medical History:  Past Medical History:  Diagnosis Date  . Bipolar 1 disorder ([place])   . Cirrhosis ([place])   . Hep C w/ coma, chronic (HCC)     Past Surgical History:  Procedure Laterality Date  . ABDOMINAL SURGERY     Family History: History reviewed. No pertinent family history. Family Psychiatric  History: He does not  know of any family history of mental illness Social History:  History  Alcohol Use No     History  Drug Use No    Social History   Social History  . Marital status: Single    Spouse name: N/A  . Number of children: N/A  . Years of education: N/A   Social History Main Topics  . Smoking status: Former Research scientist (life sciences)  . Smokeless tobacco: Never Used  . Alcohol use No  . Drug use: No  . Sexual activity: No   Other Topics Concern  . None   Social History Narrative  . None   Additional Social History:    Allergies:  No Known Allergies  Labs:  Results for orders placed or performed during the hospital encounter of 04/05/16 (from the past 48 hour(s))  CBC     Status: Abnormal   Collection Time: 04/05/16 10:29 PM  Result Value Ref Range   WBC 2.2 (L) 3.8 - 10.6 K/uL   RBC 3.06 (L) 4.40 - 5.90 MIL/uL   Hemoglobin 9.4 (L) 13.0 - 18.0 g/dL   HCT 28.2 (L) 40.0 - 52.0 %   MCV 92.1 80.0 - 100.0 fL   MCH 30.7 26.0 - 34.0 pg   MCHC 33.3 32.0 - 36.0 g/dL   RDW 18.8 (H) 11.5 - 14.5 %   Platelets 33 (L) 150 - 440 K/uL  Comprehensive metabolic panel     Status: Abnormal   Collection Time: 04/05/16 10:29 PM  Result Value Ref Range   Sodium 135 135 - 145 mmol/L   Potassium 3.2 (L) 3.5 - 5.1 mmol/L   Chloride 109 101 - 111 mmol/L   CO2 21 (L) 22 - 32 mmol/L   Glucose, Bld 131 (H) 65 - 99 mg/dL   BUN 7 6 - 20 mg/dL   Creatinine, Ser 0.58 (L) 0.61 - 1.24 mg/dL   Calcium 8.0 (L) 8.9 - 10.3 mg/dL   Total Protein 5.7 (L) 6.5 - 8.1 g/dL   Albumin 2.9 (L) 3.5 - 5.0 g/dL   AST 40 15 - 41 U/L   ALT 19 17 - 63 U/L   Alkaline Phosphatase 96 38 - 126 U/L   Total Bilirubin 0.8 0.3 - 1.2 mg/dL   GFR calc non Af Amer >60 >60 mL/min   GFR calc Af Amer >60 >60 mL/min    Comment: (NOTE) The eGFR has been calculated using the CKD EPI equation. This calculation has not been validated in all clinical situations. eGFR's persistently <60 mL/min signify possible Chronic Kidney Disease.    Anion gap  5 5 - 15  Troponin I     Status: None   Collection Time: 04/05/16 10:29 PM  Result Value Ref Range   Troponin I <0.03 <0.03 ng/mL  MRSA PCR Screening     Status: None   Collection Time: 04/06/16  1:55 AM  Result Value Ref Range   MRSA by PCR NEGATIVE NEGATIVE  Comment:        The GeneXpert MRSA Assay (FDA approved for NASAL specimens only), is one component of a comprehensive MRSA colonization surveillance program. It is not intended to diagnose MRSA infection nor to guide or monitor treatment for MRSA infections.   CBC     Status: Abnormal   Collection Time: 04/06/16  4:44 AM  Result Value Ref Range   WBC 1.9 (L) 3.8 - 10.6 K/uL   RBC 3.18 (L) 4.40 - 5.90 MIL/uL   Hemoglobin 9.9 (L) 13.0 - 18.0 g/dL   HCT 29.5 (L) 40.0 - 52.0 %   MCV 92.6 80.0 - 100.0 fL   MCH 31.1 26.0 - 34.0 pg   MCHC 33.6 32.0 - 36.0 g/dL   RDW 18.7 (H) 11.5 - 14.5 %   Platelets 27 (LL) 150 - 440 K/uL    Comment: CRITICAL RESULT CALLED TO, READ BACK BY AND VERIFIED WITH:  [facility] SIMSER AT 0555 04/06/16 SDR   Basic metabolic panel     Status: Abnormal   Collection Time: 04/06/16  4:44 AM  Result Value Ref Range   Sodium 141 135 - 145 mmol/L   Potassium 3.6 3.5 - 5.1 mmol/L   Chloride 115 (H) 101 - 111 mmol/L   CO2 21 (L) 22 - 32 mmol/L   Glucose, Bld 112 (H) 65 - 99 mg/dL   BUN 6 6 - 20 mg/dL   Creatinine, Ser 0.49 (L) 0.61 - 1.24 mg/dL   Calcium 7.8 (L) 8.9 - 10.3 mg/dL   GFR calc non Af Amer >60 >60 mL/min   GFR calc Af Amer >60 >60 mL/min    Comment: (NOTE) The eGFR has been calculated using the CKD EPI equation. This calculation has not been validated in all clinical situations. eGFR's persistently <60 mL/min signify possible Chronic Kidney Disease.    Anion gap 5 5 - 15  Magnesium     Status: None   Collection Time: 04/06/16  4:44 AM  Result Value Ref Range   Magnesium 1.9 1.7 - 2.4 mg/dL  Phosphorus     Status: None   Collection Time: 04/06/16  4:44 AM  Result Value Ref Range    Phosphorus 4.0 2.5 - 4.6 mg/dL  Salicylate level     Status: None   Collection Time: 04/06/16  4:44 AM  Result Value Ref Range   Salicylate Lvl <2.3 2.8 - 30.0 mg/dL  Acetaminophen level     Status: Abnormal   Collection Time: 04/06/16  4:44 AM  Result Value Ref Range   Acetaminophen (Tylenol), Serum <10 (L) 10 - 30 ug/mL    Comment:        THERAPEUTIC CONCENTRATIONS VARY SIGNIFICANTLY. A RANGE OF 10-30 ug/mL MAY BE AN EFFECTIVE CONCENTRATION FOR MANY PATIENTS. HOWEVER, SOME ARE BEST TREATED AT CONCENTRATIONS OUTSIDE THIS RANGE. ACETAMINOPHEN CONCENTRATIONS >150 ug/mL AT 4 HOURS AFTER INGESTION AND >50 ug/mL AT 12 HOURS AFTER INGESTION ARE OFTEN ASSOCIATED WITH TOXIC REACTIONS.     Current Facility-Administered Medications  Medication Dose Route Frequency Provider Last Rate Last Dose  . OLANZapine (ZYPREXA) tablet 10 mg  10 mg Oral QHS Gonzella Lex, MD      . ondansetron [facility]) injection 4 mg  4 mg Intravenous Q6H PRN Alexis Hugelmeyer, DO      . pantoprazole (PROTONIX) EC tablet 40 mg  40 mg Oral QAC breakfast Alexis Hugelmeyer, DO   40 mg at 04/06/16 0920  . potassium chloride (KLOR-CON) packet 20 mEq  20 mEq Oral  BID Alexis Hugelmeyer, DO   20 mEq at 04/06/16 0920    Musculoskeletal: Strength & Muscle Tone: within normal limits Gait & Station: normal Patient leans: N/A  Psychiatric Specialty Exam: Physical Exam  Nursing note and vitals reviewed. Constitutional: He appears well-developed and well-nourished.  HENT:  Head: Normocephalic and atraumatic.  Eyes: Conjunctivae are normal. Pupils are equal, round, and reactive to light.  Neck: Normal range of motion.  Cardiovascular: Normal heart sounds.   Respiratory: Effort normal. No respiratory distress.  GI: Soft.  Musculoskeletal: Normal range of motion.  Neurological: He is alert.  Skin: Skin is warm and dry.  Psychiatric: He has a normal mood and affect. His speech is normal and behavior is normal. Cognition  and memory are normal. He expresses impulsivity. He expresses suicidal ideation.    Review of Systems  Constitutional: Negative.   HENT: Negative.   Eyes: Negative.   Respiratory: Negative.   Cardiovascular: Negative.   Gastrointestinal: Negative.   Musculoskeletal: Negative.   Skin: Negative.   Neurological: Negative.   Psychiatric/Behavioral: Positive for suicidal ideas. Negative for depression, hallucinations, memory loss and substance abuse. The patient is nervous/anxious. The patient does not have insomnia.     Blood pressure 112/66, pulse 70, temperature 98.1 F (36.7 C), temperature source Oral, resp. rate (!) 21, height 6' 1"  (1.854 m), weight 88.3 kg (194 lb 10.7 oz), SpO2 94 %.Body mass index is 25.68 kg/m.  General Appearance: Fairly Groomed  Eye Contact:  Good  Speech:  Clear and Coherent  Volume:  Normal  Mood:  Depressed  Affect:  Congruent  Thought Process:  Goal Directed  Orientation:  Full (Time, Place, and Person)  Thought Content:  Logical  Suicidal Thoughts:  Yes.  without intent/plan  Homicidal Thoughts:  No  Memory:  Immediate;   Good Recent;   Fair Remote;   Fair  Judgement:  Fair  Insight:  Fair  Psychomotor Activity:  Normal  Concentration:  Concentration: Fair  Recall:  [employer] of Knowledge:  Fair  Language:  Fair  Akathisia:  No  Handed:  Right  AIMS (if indicated):     Assets:  Communication Skills Desire for Improvement Financial Resources/Insurance Housing Resilience Social Support  ADL's:  Intact  Cognition:  WNL  Sleep:        Treatment Plan Summary: Daily contact with patient to assess and evaluate symptoms and progress in treatment, Medication management and Plan This is a 55 year old man who made a serious suicide attempt but it was based on his rational or at least rational by his standards understatedly of his cancer diagnosis. Although he describes himself as being depressed it sounds like he is mostly sad and anxious  about his medical problems. He is not having any changes in sleep or appetite and he still reports having enjoyment of the normal things that he enjoys in life. He is very lucid in discussing the current situation. He states now that he does not think that he will act on trying to kill himself again or at least not now while he is still feeling well. He states that he now understands that there is no benefit to killing himself while he is still feeling relatively well. He is planning to go back to [facility] and at least consider the offer of cancer treatment. At this point I don't think he is a likely suicide risk in the unit. I plan to take him off of precautions and we can probably take him off  commitment. I don't think he probably needs to go to the psychiatry ward but I will follow up tomorrow. Also the patient asked me very clearly if he could be back on his Zyprexa. I did look at his EKG and see that his QT interval is prolonged. Follow Zyprexa like all atypical antipsychotics has some concern about prolonging the QT interval it is not one of the worst offenders and the patient has been dependent on it for mood stability for years. I think he is much more likely to suffer without it than with that. I have gone ahead and put in the order to give him his 10 mg dose, which is a relatively low dose, of Zyprexa at night.  Disposition: Patient does not meet criteria for psychiatric inpatient admission. Supportive therapy provided about ongoing stressors.  Alethia Berthold, MD 04/06/2016 7:22 PM

## 2016-04-06 NOTE — Progress Notes (Signed)
Per patient request, the orders for ribavirin and sofosbuvir-velpatasvir have been discontinued. Patient says he doesn't take these any longer.   Theordore Cisnero A. Campbell, [place].D., BCPS Clinical Pharmacist 04/06/2016 0159

## 2016-04-06 NOTE — Consult Note (Signed)
Cardiology Consultation Note  Patient ID: Aaron Jacobson, MRN: [id number], DOB/AGE: 27-Jun-1961 55 y.o. Admit date: 04/05/2016   Date of Consult: 04/06/2016 Primary Physician: Rayvon Char, MD Primary Cardiologist: New to [facility] Requesting Physician: Dr. Stevenson Clinch, MD  Chief Complaint: Attempted suicide attempt  Reason for Consult: Beta blocker overdose with propranolol   HPI: 55 y.o. male with h/o hepatitis C, cirrhosis, liver carcinoma, reported dilated aorta on propranolol (has not taken Coreg in years), ongoing tobacco abuse, GERD, bipolar type I, depression, and prior incarceration x 9 years who presented to [facility] on his own accord after intentionally ingesting approximately 40 tabs of propranolol 25 mg in an effort to commit suicide, then having second thoughts. Cardiology is asked to evaluate to clear from a cardiac standpoint for inpatient psychiatric treatment.  He has never seen a cardiologist before and has never had a stress test or cardiac cath. He has been diagnosed with hepatitis C, cirrhosis, and lvier cancer. He has been debating his treatment options movig forward with his liver cancer. His family wants him to pursue aggressive therapy. He has been uncertain regarding this. He became increasingly depressed on 8/14 thinking about his diagnosis and treatment options. He decided on 8/14 he did not want to live any long and took approximately 40-45 tabs of propranolol 25 mg and laid down. About 20 minutes later he had to void and got up to use the restroom. He then laid back down. About 1 hour later he was thirsty and got up to get some water. It was at that time he decided "maybe this wasn't to be" and called EMS. While he was with EMS he became dizziness and lightheaded. Upon the patient's arrival to [facility] he was given charcoal, Glucagon, and calcium gluconate and started to feel much better. Labs showed a negative troponin x 1, negative Tylenol and ASA level. Glucose 131, SCr 0.58, albumin 2.9, K+  3.2, normal LFTs and bili, WBC 2.2, hgb 9.4-->9.9, Mg++ 1.9. ECG as below. He currently has no complaints and has been in sinus rhythm to mildly bradycardic on tele.   Past Medical History:  Diagnosis Date  . Bipolar 1 disorder ([place])   . Cirrhosis ([place])   . Hep C w/ coma, chronic (HCC)       Most Recent Cardiac Studies: none   Surgical History:  Past Surgical History:  Procedure Laterality Date  . ABDOMINAL SURGERY       Home Meds: Prior to Admission medications   Medication Sig Start Date End Date Taking? Authorizing Provider  carvedilol (COREG) 6.25 MG tablet Take 6.25 mg by mouth 2 (two) times daily with a meal.    Historical Provider, MD  ibuprofen (ADVIL,MOTRIN) 600 MG tablet Take 600 mg by mouth every 6 (six) hours as needed for moderate pain.    Historical Provider, MD  OLANZapine (ZYPREXA) 10 MG tablet Take 10 mg by mouth at bedtime.    Historical Provider, MD  omeprazole (PRILOSEC) 10 MG capsule Take 10 mg by mouth daily.    Historical Provider, MD  ribavirin (COPEGUS) 200 MG tablet Take 2 tablets by mouth 2 (two) times daily. 03/07/15   Historical Provider, MD  Sofosbuvir-Velpatasvir 400-100 MG TABS Take 1 tablet by mouth daily at 8 pm. 03/07/15   Historical Provider, MD    Inpatient Medications:  . pantoprazole  40 mg Oral QAC breakfast  . potassium chloride  20 mEq Oral BID   . sodium chloride 75 mL/hr at 04/06/16 0204    Allergies: No  Known Allergies  Social History   Social History  . Marital status: Single    Spouse name: N/A  . Number of children: N/A  . Years of education: N/A   Occupational History  . Not on file.   Social History Main Topics  . Smoking status: Former Research scientist (life sciences)  . Smokeless tobacco: Never Used  . Alcohol use No  . Drug use: No  . Sexual activity: No   Other Topics Concern  . Not on file   Social History Narrative  . No narrative on file     History reviewed. No pertinent family history.   Review of Systems: Review of  Systems  Constitutional: Positive for malaise/fatigue. Negative for chills, diaphoresis, fever and weight loss.  HENT: Negative for congestion.   Eyes: Negative for discharge and redness.  Respiratory: Negative for cough, sputum production, shortness of breath and wheezing.   Cardiovascular: Negative for chest pain, palpitations, orthopnea, claudication, leg swelling and PND.  Gastrointestinal: Negative for abdominal pain, heartburn, nausea and vomiting.  Musculoskeletal: Negative for falls and myalgias.  Skin: Negative for rash.  Neurological: Negative for dizziness, tingling, tremors, sensory change, speech change, focal weakness, loss of consciousness and weakness.  Endo/Heme/Allergies: Does not bruise/bleed easily.  Psychiatric/Behavioral: Positive for depression, substance abuse and suicidal ideas. Negative for hallucinations and memory loss. The patient is nervous/anxious. The patient does not have insomnia.   All other systems reviewed and are negative.   Labs:  Recent Labs  04/05/16 2229  TROPONINI <0.03   Lab Results  Component Value Date   WBC 1.9 (L) 04/06/2016   HGB 9.9 (L) 04/06/2016   HCT 29.5 (L) 04/06/2016   MCV 92.6 04/06/2016   PLT 27 (LL) 04/06/2016    Recent Labs Lab 04/05/16 2229 04/06/16 0444  NA 135 141  K 3.2* 3.6  CL 109 115*  CO2 21* 21*  BUN 7 6  CREATININE 0.58* 0.49*  CALCIUM 8.0* 7.8*  PROT 5.7*  --   BILITOT 0.8  --   ALKPHOS 96  --   ALT 19  --   AST 40  --   GLUCOSE 131* 112*   No results found for: CHOL, HDL, LDLCALC, TRIG No results found for: DDIMER  Radiology/Studies:  No results found.  EKG: Interpreted by me showed: sinus bradycardia, 58 bpm, prolonged QTc 490 msec, no acute s/t changes  Telemetry: Interpreted by me showed: NSR to sinus bradycardia in the 50's bpm. No markedly bradycardic heart rates  Weights: Filed Weights   04/05/16 2230 04/06/16 0145  Weight: 180 lb (81.6 kg) 194 lb 10.7 oz (88.3 kg)      Physical Exam: Blood pressure 116/67, pulse 62, temperature 98.1 F (36.7 C), temperature source Oral, resp. rate 15, height 6\' 1"  (1.854 m), weight 194 lb 10.7 oz (88.3 kg), SpO2 94 %. Body mass index is 25.68 kg/m. General: Well developed, well nourished, in no acute distress. Head: Normocephalic, atraumatic, sclera non-icteric, no xanthomas, nares are without discharge.  Neck: Negative for carotid bruits. JVD not elevated. Lungs: Clear bilaterally to auscultation without wheezes, rales, or rhonchi. Breathing is unlabored. Heart: RRR with S1 S2. No murmurs, rubs, or gallops appreciated. Abdomen: Soft, non-tender, non-distended with normoactive bowel sounds. No hepatomegaly. No rebound/guarding. No obvious abdominal masses. Msk:  Strength and tone appear normal for age. Extremities: No clubbing or cyanosis. No edema. Distal pedal pulses are 2+ and equal bilaterally. Neuro: Alert and oriented X 3. No facial asymmetry. No focal deficit. Moves all extremities  spontaneously. Psych:  Responds to questions appropriately with a normal affect.    Assessment and Plan:  Principal Problem:   Suicide attempt by beta blocker overdose ([place]) Active Problems:   Depression   Bipolar 1 disorder (HCC)   Hypotension   Hepatitis C   Liver cancer ([place])    1. Intentional beta blocker overdose: -Stable -Heart rates in the 60's bpm currently -Review of telemetry shows no markedly bradycardic rates -Patient reports taking propranolol 25 mg, 40 tabs -Has received Glucagon, IV calcium gluconate, charcoal  -Blood sugars have been stable, not needing dextrose  -Check echo -Monitor on telemetry  -Psych evaluation pending -Hold Av nodal blocking agents -Caution with future medications -Sitter in the room  2. Liver cancer: -Per IM  3. Hepatitis C: -Per IM  4. Depression/bipolar type I: -Per psych  5. Reported dilated aorta: -Check echo as above  6. Ongoing tobacco abuse: -Cessation  advised   7. Pancytopenia: -Felt to be 2/2 liver cancer -Recommend HIV screen (negative 03/15/2014)   Signed, Christell Faith, PA-C [place] Pager: [phone] 04/06/2016, 8:02 AM

## 2016-04-06 NOTE — H&P (Signed)
[place] @ [facility] Admission History and Physical Harvie Bridge, D.O.  ---------------------------------------------------------------------------------------------------------------------   PATIENT NAME: Aaron Jacobson MR#: [id number] DATE OF BIRTH: 1961-02-20 DATE OF ADMISSION: 04/05/2016 PRIMARY CARE PHYSICIAN: Rayvon Char, MD  REQUESTING/REFERRING PHYSICIAN: ED Dr. Quentin Cornwall  CHIEF COMPLAINT: Chief Complaint  Patient presents with  . Drug Overdose  . Depression    HISTORY OF PRESENT ILLNESS: Aaron Jacobson is a 55 y.o. male with a known history of Hepatitis C, hepatocellular carcinoma, bipolar 1 was in a usual state of health until this afternoon when he began to feel increasingly depressed over his diagnosis of liver cancer and concern about the possibility of being sick, receiving treatment and dying. He states that he has become increasingly depressed in today he decided that he did not want to pursue treatment. He states that his friends and family have been encouraging him to pursue chemotherapy and radiation of which he is afraid. He took about 40 tablets of 25 mg of propranolol  Around 8 PM. He states that about an hour later he regretted the decision and called 911. He denies any symptoms at the moment except for dry mouth. At present he states that he would like to be admitted for inpatient psychiatric workup and treatment secondary to suicidal ideations and attempts. He is still suicidal at this time although he states that he would not try to carry out a suicide again.  Otherwise there has been no change in status. Patient has been taking medication as prescribed and there has been no recent change in medication or diet.  There has been no recent illness, travel or sick contacts.    Patient denies fevers/chills, weakness, dizziness, chest pain, shortness of breath, N/V/C/D, abdominal pain, dysuria/frequency, changes in mental status.   EMS/ED COURSE:    Patient received calcium gluconate, active charcoal, glucagon, Zofran and normal saline.  PAST MEDICAL HISTORY: Past Medical History:  Diagnosis Date  . Bipolar 1 disorder ([place])   . Cirrhosis ([place])   . Hep C w/ coma, chronic (HCC)     Hepatocellular carcinoma  PAST SURGICAL HISTORY: Past Surgical History:  Procedure Laterality Date  . ABDOMINAL SURGERY        SOCIAL HISTORY: Social History  Substance Use Topics  . Smoking status: Former Research scientist (life sciences)  . Smokeless tobacco: Never Used  . Alcohol use No      FAMILY HISTORY: No family history on file.   MEDICATIONS AT HOME: Prior to Admission medications   Medication Sig Start Date End Date Taking? Authorizing Provider  carvedilol (COREG) 6.25 MG tablet Take 6.25 mg by mouth 2 (two) times daily with a meal.    Historical Provider, MD  ibuprofen (ADVIL,MOTRIN) 600 MG tablet Take 600 mg by mouth every 6 (six) hours as needed for moderate pain.    Historical Provider, MD  OLANZapine (ZYPREXA) 10 MG tablet Take 10 mg by mouth at bedtime.    Historical Provider, MD  omeprazole (PRILOSEC) 10 MG capsule Take 10 mg by mouth daily.    Historical Provider, MD  ribavirin (COPEGUS) 200 MG tablet Take 2 tablets by mouth 2 (two) times daily. 03/07/15   Historical Provider, MD  Sofosbuvir-Velpatasvir 400-100 MG TABS Take 1 tablet by mouth daily at 8 pm. 03/07/15   Historical Provider, MD      DRUG ALLERGIES: No Known Allergies   REVIEW OF SYSTEMS: CONSTITUTIONAL: No fever/chills, fatigue, weakness, weight gain/loss, headache EYES: No blurry or double vision. ENT: No tinnitus, postnasal drip, redness or soreness  of the oropharynx. RESPIRATORY: No cough, wheeze, hemoptysis, dyspnea. CARDIOVASCULAR: No chest pain, orthopnea, palpitations, syncope. GASTROINTESTINAL: No nausea, vomiting, constipation, diarrhea, abdominal pain, hematemesis, melena or hematochezia. GENITOURINARY: No dysuria or hematuria. ENDOCRINE: No polyuria or nocturia. No  heat or cold intolerance. HEMATOLOGY: No anemia, bruising, bleeding. INTEGUMENTARY: No rashes, ulcers, lesions. MUSCULOSKELETAL: No arthritis, swelling, gout. NEUROLOGIC: No numbness, tingling, weakness or ataxia. No seizure-type activity. PSYCHIATRIC: Positive for anxiety, depression and suicidal ideation.  PHYSICAL EXAMINATION: VITAL SIGNS: Blood pressure (!) 95/59, pulse 72, temperature 97.6 F (36.4 C), temperature source Oral, resp. rate (!) 22, height 6\' 1"  (1.854 m), weight 81.6 kg (180 lb), SpO2 95 %.  GENERAL: 55 y.o.-year-old White male patient, well-developed, well-nourished lying in the bed in no acute distress.  Pleasant and cooperative.   HEENT: Head atraumatic, normocephalic. Pupils equal, round, reactive to light and accommodation. No scleral icterus. Extraocular muscles intact. Nares are patent. Oropharynx is clear. Mucus membranes moist. NECK: Supple, full range of motion. No JVD, no bruit heard. No thyroid enlargement, no tenderness, no cervical lymphadenopathy. CHEST: Normal breath sounds bilaterally. No wheezing, rales, rhonchi or crackles. No use of accessory muscles of respiration.  No reproducible chest wall tenderness.  CARDIOVASCULAR: S1, S2 normal. No murmurs, rubs, or gallops. Cap refill <2 seconds. ABDOMEN: Soft, nontender, nondistended. No rebound, guarding, rigidity. Normoactive bowel sounds present in all four quadrants. No organomegaly or mass. EXTREMITIES: Full range of motion. No pedal edema, cyanosis, or clubbing. NEUROLOGIC: Cranial nerves II through XII are grossly intact with no focal sensorimotor deficit. Muscle strength 5/5 in all extremities. Sensation intact. Gait not checked. PSYCHIATRIC: The patient is alert and oriented x 3. Flattened affect, depressed mood, normal thought content. SKIN: Warm, dry, and intact without obvious rash, lesion, or ulcer.  LABORATORY PANEL:  CBC  Recent Labs Lab 04/05/16 2229  WBC 2.2*  HGB 9.4*  HCT 28.2*  PLT  33*   ----------------------------------------------------------------------------------------------------------------- Chemistries  Recent Labs Lab 04/05/16 2229  NA 135  K 3.2*  CL 109  CO2 21*  GLUCOSE 131*  BUN 7  CREATININE 0.58*  CALCIUM 8.0*  AST 40  ALT 19  ALKPHOS 96  BILITOT 0.8   ------------------------------------------------------------------------------------------------------------------ Cardiac Enzymes  Recent Labs Lab 04/05/16 2229  TROPONINI <0.03   ------------------------------------------------------------------------------------------------------------------  RADIOLOGY: No results found.  EKG:  IMPRESSION AND PLAN:  This is a 55 y.o. male with a history of Hepatocellular carcinoma, cirrhosis, hepatitis C and bipolar 1 now being admitted with: 1. Hypotension secondary to intentional overdose with beta blocker - patient will be admitted to the intensive care unit for monitoring. At present he is hypotensive in the [id number] systolic after 2 L of fluid however he is mentating well.Marland Kitchen His heart rate has been in the 70s. We'll continue IV fluid hydration and hold his beta blocker. The critical care team has been notified and is compared to give further orders if the patient becomes increasingly hypotensive or bradycardic. 2. Suicide attempt - one to one safety sitter ordered for bedside. Inpatient psychiatry consult requested. 3. Hypokalemia - replace by mouth 4. Pancytopenia - chronic secondary to hepatocellular carcinoma, hepatitis C and cirrhosis. 5. Hepatocellular carcinoma, cirrhosis and hepatitis C-consider palliative care consult. Continue outpatient oncology follow-up.   Diet/Nutrition: Heart healthy Fluids: IV normal saline DVT Px:  SCDs and early ambulation. Chemotherapy prophylaxis contraindicated due to thrombocytopenia. Code Status: Full  All the records are reviewed and case discussed with ED provider. Management plans discussed with the  patient and/or family who express  understanding and agree with plan of care.   TOTAL TIME TAKING CARE OF THIS PATIENT: 60 minutes.   Latessa Tillis D.O. on 04/06/2016 at 12:45 AM Between 7am to 6pm - Pager - [phone] After 6pm go to [email or web address] - [employer] [facility] Office [phone] CC: Primary care physician; Rayvon Char, MD     Note: This dictation was prepared with Dragon dictation along with smaller phrase technology. Any transcriptional errors that result from this process are unintentional.

## 2016-04-06 NOTE — Progress Notes (Signed)
Sitter discontinued per Dr. Weber Cooks.

## 2016-04-06 NOTE — Progress Notes (Signed)
Notified Dr. Marcille Blanco of pt having elevated T waves. Physician acknowledged. No new orders. Will continue to monitor.

## 2016-04-06 NOTE — Progress Notes (Signed)
Patient wanted his cell phone, nurse was notified. The nurse said it was fine that he has his cell phone with him.

## 2016-04-06 NOTE — Consult Note (Signed)
PULMONARY / CRITICAL CARE MEDICINE   Name: Aaron Jacobson MRN: [id number] DOB: [birth year]    ADMISSION DATE:  04/05/2016 Referring MD - Dr. Sherral [facility] ED) Reason for Consult - Beta Blocker overdose, ICU monitoring  HISTORY: 55 y.o. 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 Has a Hx of Hep C, followed and [facility], and Thrombocytopenia. Had an episode of dark stool about 3 days ago.  Noted to have mild soft SBP, 90s, and low HR, 60s, upon presentation, PCCM consulted for ICU monitoring.  Upon the patient's arrival to [facility] he was given charcoal, Glucagon, and calcium gluconate and started to feel much better. Stable overnight, with no acute events.  Dr. Drue Novel Note [facility] liver program) 11/06/15 I spent an extended period today talking to [name]. He has HCV-related cirrhosis and now, multifocal HCC (Bilobar) 2.0 cm area in hepatic segment 2 and a 1.8 cm focus in hepatic dome. AFP continues to rise, now > 1000. He is not an OLT candidate due to psychosocial issues. He is aware of this. His HCV has been treated and cured. He has no evidence of hepatic decompensation. He does have portal HTN and non-bleeding esophageal varices and splenorenal shunt. We attempted to TACE his new small HCC's. He had reversal of flow in his PV (small amount of bland thrombus) precluding this approach. He was offered Y90 therapy. He has declined. He also declines follow-up at this time with GI Oncology or to have a follow-up Liver protocol CT scan. He states to me that he understands that he has untreated liver cancer but he feels good at this time and does not want further therapy. His scan and  appointment with Oncology have been canceled. I discussed what can happen with worsening cancer burden. He is aware of his options and will re-consider imaging at his follow-up appointment. Please feel free to page me with questions.  Jama M. Drue Novel, MD [facility] Liver Program       SIGNIFICANT EVENTS: 8/14>attempted suicide with beta blocker, got charcoal, glucagon and Ca gluc in the ER,  8/15> stable in ICU, mild hypotension and low HR, transfer to med/surg, psych consult pending  VITAL SIGNS: Temp:  [97.6 F (36.4 C)-98.8 F (37.1 C)] 98.5 F (36.9 C) (08/15 0805) Pulse Rate:  [62-92] 70 (08/15 0700) Resp:  [14-59] 17 (08/15 0700) BP: (83-116)/(54-73) 112/66 (08/15 0700) SpO2:  [93 %-98 %] 93 % (08/15 0700) Weight:  [180 lb (81.6 kg)-194 lb 10.7 oz (88.3 kg)] 194 lb 10.7 oz (88.3 kg) (08/15 0145) HEMODYNAMICS:   VENTILATOR SETTINGS:   INTAKE / OUTPUT:  Intake/Output Summary (Last 24 hours) at 04/06/16 0844 Last data filed at 04/06/16 0649  Gross per 24 hour  Intake           551.25 ml  Output             1600 ml  Net         -1048.75 ml    Review of Systems  Constitutional: Negative for chills and fever.  Eyes: Negative for blurred vision.  Respiratory: Negative for cough, hemoptysis, sputum production, shortness of breath and wheezing.   Cardiovascular: Negative for chest pain, palpitations and leg swelling.  Gastrointestinal: Positive for melena. Negative for heartburn.  Genitourinary: Negative for dysuria.  Musculoskeletal: Negative for myalgias.  Skin: Negative for rash.  Neurological: Negative for dizziness and headaches.  Endo/Heme/Allergies: Bruises/bleeds easily.  Psychiatric/Behavioral: Positive for depression and suicidal ideas. Negative for hallucinations and substance abuse. The patient is not nervous/anxious.     Physical Exam  Constitutional: He is oriented to person, place, and time and well-developed, well-nourished, and in no distress.  HENT:   Head: Normocephalic and atraumatic.  Right Ear: External ear normal.  Left Ear: External ear normal.  Eyes: Conjunctivae are normal. Pupils are equal, round, and reactive to light.  Neck: Normal range of motion. Neck supple.  Cardiovascular: Normal rate, regular rhythm, normal heart sounds and intact distal pulses.   Pulmonary/Chest: Effort normal and breath sounds normal. No respiratory distress. He has no wheezes. He exhibits no tenderness.  Musculoskeletal: Normal range of motion.  Neurological: He is alert and oriented to person, place, and time.  Skin: Skin is warm and dry.  Psychiatric: Affect normal.  Nursing note and vitals reviewed.    LABS:  CBC  Recent Labs Lab 04/05/16 2229 04/06/16 0444  WBC 2.2* 1.9*  HGB 9.4* 9.9*  HCT 28.2* 29.5*  PLT 33* 27*   Coag's No results for input(s): APTT, INR in the last 168 hours. BMET  Recent Labs Lab 04/05/16 2229 04/06/16 0444  NA 135 141  K 3.2* 3.6  CL 109 115*  CO2 21* 21*  BUN 7 6  CREATININE 0.58* 0.49*  GLUCOSE 131* 112*   Electrolytes  Recent Labs Lab 04/05/16 2229 04/06/16 0444  CALCIUM 8.0* 7.8*  MG  --  1.9  PHOS  --  4.0   Sepsis Markers No results for input(s): LATICACIDVEN, PROCALCITON, O2SATVEN in the last 168 hours. ABG No results for input(s): PHART, PCO2ART, PO2ART in the last 168 hours. Liver Enzymes  Recent Labs Lab 04/05/16 2229  AST 40  ALT 19  ALKPHOS 96  BILITOT 0.8  ALBUMIN 2.9*   Cardiac Enzymes  Recent Labs Lab 04/05/16 2229  TROPONINI <0.03   Glucose No results for input(s): GLUCAP in the last 168 hours.  Imaging No results found.  LINES: PIVs  CULTURES:   ANTIBIOTICS  ASSESSMENT / PLAN: 55 yo with Hx of HepC cirrhosis, HCC, hypertension, attempted suicide with beta blocker OD, now with mild hypotension and asymptomatic low HR.   Hypotension Low Heart Rate Melanotic Episode Hep C - treated [place] - decline  treatment Pancytopenia Thrombocytopenia Suicide attempt Hypokalemia Biplor I/Schizophrenia  Plan: - stable overnight, now further episodes for severe hypotension or bradycardia - he has declined in the past treatment for his [place], but is now reconsidering treatment - has chronic thrombocytopenia, with a melanotic episode 3-4 days ago, but none since then - cont with current management and supportive care - psych consult pending - stable hemodynamics - sitter at bedside - [facility]; see note above - cardiology following.  - patient stable to transfer to psych or Medsurg unit    Thank you for consulting [facility], we will signoff at this  time.  Please feel free to contact us with any questions at [phone] (please enter 7-digits).   Thank you for consulting [place] [facility], Please feel free to contacts [place] with any questions at [phone] (please enter 7-digits).  I have personally obtained a history, examined the patient, evaluated laboratory and imaging results, formulated  the assessment and plan and placed orders.  Critical Care Time devoted to patient care services described in this note is 45 minutes.    Vilinda Boehringer, MD [place] [facility] Pager [phone] (please enter 7-digits) On Call Pager [phone] (please enter 7-digits)  Note: This note was prepared with Dragon dictation along with smaller phrase technology. Any transcriptional errors that result from this process are unintentional.

## 2016-04-06 NOTE — Progress Notes (Signed)
Pt gave verbal consent to this nurse, witnessed by Katharine Look, R.N., to talk to his parole officer about his hospitalization.    P.O. Officer-  F.[place]. [place], [place]

## 2016-04-06 NOTE — Progress Notes (Signed)
[place] [facility]                                                                                                                                                                                            Patient Demographics   Aaron Jacobson, is a 55 y.o. male, DOB - 11/22/1960, [id number]  Admit date - 04/05/2016   Admitting Physician Lytle [place], MD  Outpatient Primary MD for the patient is [place], MD   LOS - 1  Subjective: Patient admitted with propranolol overdose, his heart rate is currently stable he states that he is still feeling very depressed due to his cancer and his other life situations.    Review of Systems:   CONSTITUTIONAL: No documented fever. No fatigue, weakness. No weight gain, no weight loss.  EYES: No blurry or double vision.  ENT: No tinnitus. No postnasal drip. No redness of the oropharynx.  RESPIRATORY: No cough, no wheeze, no hemoptysis. No dyspnea.  CARDIOVASCULAR: No chest pain. No orthopnea. No palpitations. No syncope.  GASTROINTESTINAL: No nausea, no vomiting or diarrhea. No abdominal pain. No melena or hematochezia.  GENITOURINARY: No dysuria or hematuria.  ENDOCRINE: No polyuria or nocturia. No heat or cold intolerance.  HEMATOLOGY: No anemia. No bruising. No bleeding.  INTEGUMENTARY: No rashes. No lesions.  MUSCULOSKELETAL: No arthritis. No swelling. No gout.  NEUROLOGIC: No numbness, tingling, or ataxia. No seizure-type activity.  PSYCHIATRIC: No anxiety. No insomnia. No ADD. Positive depression   Vitals:   Vitals:   04/06/16 0800 04/06/16 0805 04/06/16 1000 04/06/16 1100  BP:      Pulse:      Resp: 17  19 (!) 21  Temp:  98.5 F (36.9 C)  98.1 F (36.7 C)  TempSrc:  Oral  Oral  SpO2:    94%  Weight:      Height:        Wt Readings from Last 3 Encounters:  04/06/16 88.3 kg (194 lb 10.7 oz)  05/17/15 84.8 kg (187 lb)     Intake/Output Summary (Last 24 hours) at 04/06/16  1406 Last data filed at 04/06/16 1313  Gross per 24 hour  Intake          1683.75 ml  Output             2650 ml  Net          -966.25 ml    Physical Exam:   GENERAL: Pleasant-appearing in no apparent distress.  HEAD, EYES, EARS, NOSE AND THROAT: Atraumatic, normocephalic. Extraocular muscles are intact. Pupils equal and reactive to light. Sclerae anicteric. No  conjunctival injection. No oro-pharyngeal erythema.  NECK: Supple. There is no jugular venous distention. No bruits, no lymphadenopathy, no thyromegaly.  HEART: Regular rate and rhythm,. No murmurs, no rubs, no clicks.  LUNGS: Clear to auscultation bilaterally. No rales or rhonchi. No wheezes.  ABDOMEN: Soft, flat, nontender, nondistended. Has good bowel sounds. No hepatosplenomegaly appreciated.  EXTREMITIES: No evidence of any cyanosis, clubbing, or peripheral edema.  +2 pedal and radial pulses bilaterally.  NEUROLOGIC: The patient is alert, awake, and oriented x3 with no focal motor or sensory deficits appreciated bilaterally.  SKIN: Moist and warm with no rashes appreciated.  Psych:  depressed LN: No inguinal LN enlargement    Antibiotics   Anti-infectives    Start     Dose/Rate Route Frequency Ordered Stop   04/06/16 0145  ribavirin (REBETOL) capsule 400 mg  Status:  Discontinued     400 mg Oral 2 times daily 04/06/16 0144 04/06/16 0159   04/06/16 0145  Sofosbuvir-Velpatasvir 400-100 MG TABS 1 tablet  Status:  Discontinued     1 tablet Oral Daily 04/06/16 0144 04/06/16 0159      Medications   Scheduled Meds: . pantoprazole  40 mg Oral QAC breakfast  . potassium chloride  20 mEq Oral BID   Continuous Infusions:  PRN Meds:.ondansetron (ZOFRAN) IV   Data Review:   Micro Results Recent Results (from the past 240 hour(s))  MRSA PCR Screening     Status: None   Collection Time: 04/06/16  1:55 AM  Result Value Ref Range Status   MRSA by PCR NEGATIVE NEGATIVE Final    Comment:        The GeneXpert MRSA Assay  (FDA approved for NASAL specimens only), is one component of a comprehensive MRSA colonization surveillance program. It is not intended to diagnose MRSA infection nor to guide or monitor treatment for MRSA infections.     Radiology Reports No results found.   CBC  Recent Labs Lab 04/05/16 2229 04/06/16 0444  WBC 2.2* 1.9*  HGB 9.4* 9.9*  HCT 28.2* 29.5*  PLT 33* 27*  MCV 92.1 92.6  MCH 30.7 31.1  MCHC 33.3 33.6  RDW 18.8* 18.7*    Chemistries   Recent Labs Lab 04/05/16 2229 04/06/16 0444  NA 135 141  K 3.2* 3.6  CL 109 115*  CO2 21* 21*  GLUCOSE 131* 112*  BUN 7 6  CREATININE 0.58* 0.49*  CALCIUM 8.0* 7.8*  MG  --  1.9  AST 40  --   ALT 19  --   ALKPHOS 96  --   BILITOT 0.8  --    ------------------------------------------------------------------------------------------------------------------ estimated creatinine clearance is 119.3 mL/min (by C-G formula based on SCr of 0.8 mg/dL). ------------------------------------------------------------------------------------------------------------------ No results for input(s): HGBA1C in the last 72 hours. ------------------------------------------------------------------------------------------------------------------ No results for input(s): CHOL, HDL, LDLCALC, TRIG, CHOLHDL, LDLDIRECT in the last 72 hours. ------------------------------------------------------------------------------------------------------------------ No results for input(s): TSH, T4TOTAL, T3FREE, THYROIDAB in the last 72 hours.  Invalid input(s): FREET3 ------------------------------------------------------------------------------------------------------------------ No results for input(s): VITAMINB12, FOLATE, FERRITIN, TIBC, IRON, RETICCTPCT in the last 72 hours.  Coagulation profile No results for input(s): INR, PROTIME in the last 168 hours.  No results for input(s): DDIMER in the last 72 hours.  Cardiac Enzymes  Recent Labs Lab  04/05/16 2229  TROPONINI <0.03   ------------------------------------------------------------------------------------------------------------------ Invalid input(s): POCBNP    Assessment & Plan   This is a 55 y.o. male with a history of Hepatocellular carcinoma, cirrhosis, hepatitis C and bipolar 1 now being admitted with: 1.  Hypotension secondary to intentional overdose with beta blocker -blood pressure now normal continue monitor patient clinically stable to be transferred to psychiatry if seen by them and felt approved 2. Suicide attempt - one to one safety sitter psych eval pending. 3. Hypokalemia - replaced 4  hepatocellular carcinoma 5. Pancytopenia due to hepatocellular carcinoma and an chronic hepatitis C      Code Status Orders        Start     Ordered   04/06/16 0145  Full code  Continuous     04/06/16 0144    Code Status History    Date Active Date Inactive Code Status Order ID Comments User Context   This patient has a current code status but no historical code status.           Consults  Psych, cardiology   DVT Prophylaxis scd's  Lab Results  Component Value Date   PLT 27 (LL) 04/06/2016     Time Spent in minutes   44min  Greater than 50% of time spent in care coordination and counseling patient regarding the condition and plan of care.   Dustin Flock M.D on 04/06/2016 at 2:06 PM  Between 7am to 6pm - Pager - [phone]  After 6pm go to [email or web address] - password [email or web address] [facility]   Office  [phone]

## 2016-04-07 DIAGNOSIS — F200 Paranoid schizophrenia: Secondary | ICD-10-CM

## 2016-04-07 NOTE — Consult Note (Signed)
[facility] Face-to-Face Psychiatry Consult   Reason for Consult:  Consult for this 55 year old man with a history of schizophrenia currently in the hospital after an attempted suicide by overdose Referring Physician:  Posey Pronto Patient Identification: Aaron Jacobson MRN:  [id number] Principal Diagnosis: Paranoid schizophrenia [facility]) Diagnosis:   Patient Active Problem List   Diagnosis Date Noted  . Hypotension [I95.9] 04/06/2016  . Suicide attempt by beta blocker overdose ([place]) [T44.7X2A] 04/06/2016  . Depression [F32.9] 04/06/2016  . Liver cancer ([place]) [C22.9] 04/06/2016  . Bipolar 1 disorder ([place]) [F31.9] 04/06/2016  . Hepatitis C [B19.20] 04/06/2016  . Paranoid schizophrenia ([place]) [F20.0] 04/06/2016  . Bradycardia [R00.1]   . Prolonged Q-T interval on ECG [I45.81]     Total Time spent with patient: 20 minutes  Subjective:   Aaron Jacobson is a 55 y.o. male patient admitted with "I just got so depressed I thought I would end it".   Follow-up note for this 56 year old man in the critical care unit. Patient seen. Chart reviewed. Discussed case with nursing. Patient states that he has absolutely no thoughts of killing himself. He says his mood is good and he is feeling like his normal self. He is showing evidence of thinking more rationally about his cancer treatment. His probation officer appears to have made arrangements for the patient to go to [place] and get substance abuse treatment and the patient is gladly willing to do that. No change to medicine continue Zyprexa.  HPI:  Patient interviewed. Chart reviewed. Labs and vitals reviewed. 55 year old man with a history of schizophrenia came into the hospital after overdosing on his own blood pressure medicine. Patient says that he had been having suicidal thoughts for the last several days. His mood had been feeling down and negative but just about for the last week. He is particularly stressed out about his new diagnosis of cancer and the need to  make a choice about treatment. He tells me that he had been feeling under a lot of pressure from friends and family and care providers to take the offered chemotherapy and radiation therapy for his cancer but he is very ambivalent about doing it. He says that he finally reached a point where he decided that rather than go through the whole painful experience one way or another he would just kill himself. He says he took about 40 of his beta blocker blood pressure medicines and then waited a few hours assuming that he would die. It wasn't until hours later when he was not dead but was feeling sick that he called 911. He says he still having some suicidal thoughts but doesn't have any intent or plan to act on it. He went to some length to describe to me the recent ups and downs of his medical care and his thinking around suicide. It's actually all based on facts and reasonable assumptions and believes. Doesn't appear to be psychotic at all. Hasn't been drinking or using any drugs. 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  Medical history: Patient has a history of hepatitis C new diagnosis of liver cancer liver cirrhosis. He is now recovering from overdose of beta blockers. Has a prolonged QT on his EKG.  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 Drink  alcohol.  Social history: Patient lives by himself in an apartment. He does have some relatives siblings and a mother he is close with. Patient is on probation which is a big stress for him.  Past Psychiatric History: Long history of schizophrenia diagnosed years ago when he was in prison. Has been stable on olanzapine for years. Prior to this one event he had had a suicide attempt at his first diagnosis which by his report was probably 30 years ago.  No other suicide attempts since then. He's been on Zyprexa for years with good stability.  Risk to Self: Is patient at risk for suicide?: Yes Risk to Others:   Prior Inpatient Therapy:   Prior Outpatient Therapy:    Past Medical History:  Past Medical History:  Diagnosis Date  . Bipolar 1 disorder ([place])   . Cirrhosis ([place])   . Hep C w/ coma, chronic ([facility])     Past Surgical History:  Procedure Laterality Date  . ABDOMINAL SURGERY     Family History: History reviewed. No pertinent family history. Family Psychiatric  History: He does not know of any family history of mental illness Social History:  History  Alcohol Use No     History  Drug Use No    Social History   Social History  . Marital status: Single    Spouse name: N/A  . Number of children: N/A  . Years of education: N/A   Social History Main Topics  . Smoking status: Former Research scientist (life sciences)  . Smokeless tobacco: Never Used  . Alcohol use No  . Drug use: No  . Sexual activity: No   Other Topics Concern  . None   Social History Narrative  . None   Additional Social History:    Allergies:  No Known Allergies  Labs:  Results for orders placed or performed during the hospital encounter of 04/05/16 (from the past 48 hour(s))  CBC     Status: Abnormal   Collection Time: 04/05/16 10:29 PM  Result Value Ref Range   WBC 2.2 (L) 3.8 - 10.6 K/uL   RBC 3.06 (L) 4.40 - 5.90 MIL/uL   Hemoglobin 9.4 (L) 13.0 - 18.0 g/dL   HCT 28.2 (L) 40.0 - 52.0 %   MCV 92.1 80.0 - 100.0 fL   MCH 30.7 26.0 - 34.0 pg   MCHC 33.3 32.0 - 36.0 g/dL   RDW 18.8 (H) 11.5 - 14.5 %   Platelets 33 (L) 150 - 440 K/uL  Comprehensive metabolic panel     Status: Abnormal   Collection Time: 04/05/16 10:29 PM  Result Value Ref Range   Sodium 135 135 - 145 mmol/L   Potassium 3.2 (L) 3.5 - 5.1 mmol/L   Chloride 109 101 - 111 mmol/L   CO2 21 (L) 22 - 32 mmol/L   Glucose, Bld 131 (H) 65 - 99 mg/dL   BUN 7 6 - 20 mg/dL   Creatinine, Ser 0.58 (L)  0.61 - 1.24 mg/dL   Calcium 8.0 (L) 8.9 - 10.3 mg/dL   Total Protein 5.7 (L) 6.5 - 8.1 g/dL   Albumin 2.9 (L) 3.5 - 5.0 g/dL   AST 40 15 - 41 U/L   ALT 19 17 - 63 U/L   Alkaline Phosphatase 96 38 - 126 U/L   Total Bilirubin 0.8 0.3 - 1.2 mg/dL   GFR calc non Af Amer >60 >60 mL/min   GFR calc Af Amer >60 >60 mL/min    Comment: (NOTE) The eGFR has  been calculated using the CKD EPI equation. This calculation has not been validated in all clinical situations. eGFR's persistently <60 mL/min signify possible Chronic Kidney Disease.    Anion gap 5 5 - 15  Troponin I     Status: None   Collection Time: 04/05/16 10:29 PM  Result Value Ref Range   Troponin I <0.03 <0.03 ng/mL  MRSA PCR Screening     Status: None   Collection Time: 04/06/16  1:55 AM  Result Value Ref Range   MRSA by PCR NEGATIVE NEGATIVE    Comment:        The GeneXpert MRSA Assay (FDA approved for NASAL specimens only), is one component of a comprehensive MRSA colonization surveillance program. It is not intended to diagnose MRSA infection nor to guide or monitor treatment for MRSA infections.   CBC     Status: Abnormal   Collection Time: 04/06/16  4:44 AM  Result Value Ref Range   WBC 1.9 (L) 3.8 - 10.6 K/uL   RBC 3.18 (L) 4.40 - 5.90 MIL/uL   Hemoglobin 9.9 (L) 13.0 - 18.0 g/dL   HCT 29.5 (L) 40.0 - 52.0 %   MCV 92.6 80.0 - 100.0 fL   MCH 31.1 26.0 - 34.0 pg   MCHC 33.6 32.0 - 36.0 g/dL   RDW 18.7 (H) 11.5 - 14.5 %   Platelets 27 (LL) 150 - 440 K/uL    Comment: CRITICAL RESULT CALLED TO, READ BACK BY AND VERIFIED WITH:  [facility] SIMSER AT 0555 04/06/16 SDR   Basic metabolic panel     Status: Abnormal   Collection Time: 04/06/16  4:44 AM  Result Value Ref Range   Sodium 141 135 - 145 mmol/L   Potassium 3.6 3.5 - 5.1 mmol/L   Chloride 115 (H) 101 - 111 mmol/L   CO2 21 (L) 22 - 32 mmol/L   Glucose, Bld 112 (H) 65 - 99 mg/dL   BUN 6 6 - 20 mg/dL   Creatinine, Ser 0.49 (L) 0.61 - 1.24 mg/dL   Calcium 7.8  (L) 8.9 - 10.3 mg/dL   GFR calc non Af Amer >60 >60 mL/min   GFR calc Af Amer >60 >60 mL/min    Comment: (NOTE) The eGFR has been calculated using the CKD EPI equation. This calculation has not been validated in all clinical situations. eGFR's persistently <60 mL/min signify possible Chronic Kidney Disease.    Anion gap 5 5 - 15  Magnesium     Status: None   Collection Time: 04/06/16  4:44 AM  Result Value Ref Range   Magnesium 1.9 1.7 - 2.4 mg/dL  Phosphorus     Status: None   Collection Time: 04/06/16  4:44 AM  Result Value Ref Range   Phosphorus 4.0 2.5 - 4.6 mg/dL  Salicylate level     Status: None   Collection Time: 04/06/16  4:44 AM  Result Value Ref Range   Salicylate Lvl <6.7 2.8 - 30.0 mg/dL  Acetaminophen level     Status: Abnormal   Collection Time: 04/06/16  4:44 AM  Result Value Ref Range   Acetaminophen (Tylenol), Serum <10 (L) 10 - 30 ug/mL    Comment:        THERAPEUTIC CONCENTRATIONS VARY SIGNIFICANTLY. A RANGE OF 10-30 ug/mL MAY BE AN EFFECTIVE CONCENTRATION FOR MANY PATIENTS. HOWEVER, SOME ARE BEST TREATED AT CONCENTRATIONS OUTSIDE THIS RANGE. ACETAMINOPHEN CONCENTRATIONS >150 ug/mL AT 4 HOURS AFTER INGESTION AND >50 ug/mL AT 12 HOURS AFTER INGESTION ARE OFTEN ASSOCIATED  WITH TOXIC REACTIONS.     Current Facility-Administered Medications  Medication Dose Route Frequency Provider Last Rate Last Dose  . OLANZapine (ZYPREXA) tablet 10 mg  10 mg Oral QHS Gonzella Lex, MD   10 mg at 04/06/16 2228  . ondansetron (ZOFRAN) injection 4 mg  4 mg Intravenous Q6H PRN Alexis Hugelmeyer, DO      . pantoprazole (PROTONIX) EC tablet 40 mg  40 mg Oral QAC breakfast Alexis Hugelmeyer, DO   40 mg at 04/07/16 0725    Musculoskeletal: Strength & Muscle Tone: within normal limits Gait & Station: normal Patient leans: N/A  Psychiatric Specialty Exam: Physical Exam  Nursing note and vitals reviewed. Constitutional: He appears well-developed and well-nourished.   HENT:  Head: Normocephalic and atraumatic.  Eyes: Conjunctivae are normal. Pupils are equal, round, and reactive to light.  Neck: Normal range of motion.  Cardiovascular: Normal heart sounds.   Respiratory: Effort normal. No respiratory distress.  GI: Soft.  Musculoskeletal: Normal range of motion.  Neurological: He is alert.  Skin: Skin is warm and dry.  Psychiatric: He has a normal mood and affect. His speech is normal and behavior is normal. Cognition and memory are normal. He expresses impulsivity. He expresses suicidal ideation.    Review of Systems  Constitutional: Negative.   HENT: Negative.   Eyes: Negative.   Respiratory: Negative.   Cardiovascular: Negative.   Gastrointestinal: Negative.   Musculoskeletal: Negative.   Skin: Negative.   Neurological: Negative.   Psychiatric/Behavioral: Positive for suicidal ideas. Negative for depression, hallucinations, memory loss and substance abuse. The patient is nervous/anxious. The patient does not have insomnia.     Blood pressure 118/70, pulse (!) 59, temperature 97.8 F (36.6 C), temperature source Oral, resp. rate 15, height 6' 1"  (1.854 m), weight 88.3 kg (194 lb 10.7 oz), SpO2 98 %.Body mass index is 25.68 kg/m.  General Appearance: Fairly Groomed  Eye Contact:  Good  Speech:  Clear and Coherent  Volume:  Normal  Mood:  Depressed  Affect:  Congruent  Thought Process:  Goal Directed  Orientation:  Full (Time, Place, and Person)  Thought Content:  Logical  Suicidal Thoughts:  Yes.  without intent/plan  Homicidal Thoughts:  No  Memory:  Immediate;   Good Recent;   Fair Remote;   Fair  Judgement:  Fair  Insight:  Fair  Psychomotor Activity:  Normal  Concentration:  Concentration: Fair  Recall:  [employer] of Knowledge:  Fair  Language:  Fair  Akathisia:  No  Handed:  Right  AIMS (if indicated):     Assets:  Communication Skills Desire for Improvement Financial Resources/Insurance Housing Resilience Social  Support  ADL's:  Intact  Cognition:  WNL  Sleep:        Treatment Plan Summary: Daily contact with patient to assess and evaluate symptoms and progress in treatment, Medication management and Plan Continue current Zyprexa 10 mg at night. Discontinue involuntary commitment. Paperwork done and order completed on chart. Patient can be discharged to follow-up at [facility] and with his usual provider. Case reviewed with nursing.  Disposition: Patient does not meet criteria for psychiatric inpatient admission. Supportive therapy provided about ongoing stressors.  Alethia Berthold, MD 04/07/2016 2:22 PM

## 2016-04-07 NOTE — Progress Notes (Signed)
Patient to discharge to home after Dr Weber Cooks rounds on him.  Patient appears in good spirits, does not verbalize any suicidal thoughts to Probation officer.  IV site discontinued, bleeding controlled.  Instructions given, patient verbalizes understanding.

## 2016-04-07 NOTE — Plan of Care (Signed)
Problem: Safety: Goal: Ability to remain free from injury will improve Outcome: Progressing Call bell within reach and pt instructed to call prior to getting out of bed  Problem: Pain Managment: Goal: General experience of comfort will improve Outcome: Progressing Pt denies any pain or discomfort  Problem: Nutrition: Goal: Adequate nutrition will be maintained Outcome: Progressing Pt tolerating cardiac diet with excellent intake

## 2016-04-07 NOTE — Progress Notes (Signed)
Patient will discharge and be transported to [facility] in [place] per his Research officer, trade union.  Patient signed a release of records form and for his parole officer and expressed willingness to go to [place].  Dr Weber Cooks will come to see patient prior to leaving

## 2016-04-07 NOTE — Discharge Summary (Signed)
Aaron Jacobson, 55 y.o., DOB 10-20-1960, MRN [id number]. Admission date: 04/05/2016 Discharge Date 04/07/2016 Primary MD Rayvon Char, MD Admitting Physician Lytle [place], MD  Admission Diagnosis  [facility]) [R57.9] Suicide attempt [facility]) [T14.91] Intentional drug overdose, initial encounter [facility]) [T50.902A]  Discharge Diagnosis   Principal Problem:   Paranoid schizophrenia ([place])   Hypotension   Suicide attempt by beta blocker overdose ([place])   Depression   Liver cancer ([place])   Bipolar 1 disorder ([place])   Hepatitis C   Bradycardia   Prolonged Q-T interval on ECG            Hospital Course  Aaron Jacobson is a 55 y.o. male with a known history of Hepatitis C, hepatocellular carcinoma, bipolar 1 was in a usual state of health until this afternoon when he began to feel increasingly depressed over his diagnosis of liver cancer and concern about the possibility of being sick, receiving treatment and dying. He states that he has become increasingly depressed on day of admission he decided that he did not want to pursue treatment. He states that his friends and family have been encouraging him to pursue chemotherapy and radiation of which he is afraid. Patient took 40 tablets of 25 mg of propranolol and then decided to call EMS. Patient on arrival was noted to be hypotensive. Patient was given IV fluids charcoal patient continued to stay stable without any further symptoms. Patient was seen by psychiatry they felt that he was not currently suicidal or homicidal and cleared him for discharge at home patient does have outpatient psychiatrist she will follow with.          Consults  cardiology, psychiatry  Significant Tests:  See full reports for all details     No results found.     Today   Subjective:   Aaron Jacobson  feels well once to go home  Objective:   Blood pressure 118/70, pulse (!) 59, temperature 97.8 F (36.6 C), temperature source Oral, resp. rate 15, height 6\' 1"   (1.854 m), weight 88.3 kg (194 lb 10.7 oz), SpO2 98 %.  .  Intake/Output Summary (Last 24 hours) at 04/07/16 1520 Last data filed at 04/07/16 1200  Gross per 24 hour  Intake             1670 ml  Output             2425 ml  Net             -755 ml    Exam VITAL SIGNS: Blood pressure 118/70, pulse (!) 59, temperature 97.8 F (36.6 C), temperature source Oral, resp. rate 15, height 6\' 1"  (1.854 m), weight 88.3 kg (194 lb 10.7 oz), SpO2 98 %.  GENERAL:  55 y.o.-year-old patient lying in the bed with no acute distress.  EYES: Pupils equal, round, reactive to light and accommodation. No scleral icterus. Extraocular muscles intact.  HEENT: Head atraumatic, normocephalic. Oropharynx and nasopharynx clear.  NECK:  Supple, no jugular venous distention. No thyroid enlargement, no tenderness.  LUNGS: Normal breath sounds bilaterally, no wheezing, rales,rhonchi or crepitation. No use of accessory muscles of respiration.  CARDIOVASCULAR: S1, S2 normal. No murmurs, rubs, or gallops.  ABDOMEN: Soft, nontender, nondistended. Bowel sounds present. No organomegaly or mass.  EXTREMITIES: No pedal edema, cyanosis, or clubbing.  NEUROLOGIC: Cranial nerves II through XII are intact. Muscle strength 5/5 in all extremities. Sensation intact. Gait not checked.  PSYCHIATRIC: The patient is alert and oriented x 3.  SKIN: No  obvious rash, lesion, or ulcer.   Data Review     CBC w Diff: Lab Results  Component Value Date   WBC 1.9 (L) 04/06/2016   HGB 9.9 (L) 04/06/2016   HGB 12.2 (L) 05/10/2014   HCT 29.5 (L) 04/06/2016   HCT 37.2 (L) 05/10/2014   PLT 27 (LL) 04/06/2016   PLT 67 (L) 05/10/2014   LYMPHOPCT 23% 05/17/2015   MONOPCT 13% 05/17/2015   EOSPCT 7% 05/17/2015   BASOPCT 0% 05/17/2015   CMP: Lab Results  Component Value Date   NA 141 04/06/2016   NA 138 05/10/2014   K 3.6 04/06/2016   K 3.3 (L) 05/10/2014   CL 115 (H) 04/06/2016   CL 107 05/10/2014   CO2 21 (L) 04/06/2016   CO2 19 (L)  05/10/2014   BUN 6 04/06/2016   BUN 11 05/10/2014   CREATININE 0.49 (L) 04/06/2016   CREATININE 0.85 05/10/2014   PROT 5.7 (L) 04/05/2016   PROT 7.4 05/10/2014   ALBUMIN 2.9 (L) 04/05/2016   ALBUMIN 3.4 05/10/2014   BILITOT 0.8 04/05/2016   BILITOT 1.8 (H) 05/10/2014   ALKPHOS 96 04/05/2016   ALKPHOS 77 05/10/2014   AST 40 04/05/2016   AST 218 (H) 05/10/2014   ALT 19 04/05/2016   ALT 103 (H) 05/10/2014  .  Micro Results Recent Results (from the past 240 hour(s))  MRSA PCR Screening     Status: None   Collection Time: 04/06/16  1:55 AM  Result Value Ref Range Status   MRSA by PCR NEGATIVE NEGATIVE Final    Comment:        The GeneXpert MRSA Assay (FDA approved for NASAL specimens only), is one component of a comprehensive MRSA colonization surveillance program. It is not intended to diagnose MRSA infection nor to guide or monitor treatment for MRSA infections.         Code Status Orders        Start     Ordered   04/06/16 0145  Full code  Continuous     04/06/16 0144    Code Status History    Date Active Date Inactive Code Status Order ID Comments User Context   This patient has a current code status but no historical code status.          Follow-up Information    primary psychiatry Follow up in 7 day(s).        pirmay oncologist Follow up today.   Why:  as scheduled          Discharge Medications     Medication List    STOP taking these medications   carvedilol 6.25 MG tablet Commonly known as:  COREG     TAKE these medications   ibuprofen 600 MG tablet Commonly known as:  ADVIL,MOTRIN Take 600 mg by mouth every 6 (six) hours as needed for moderate pain.   OLANZapine 10 MG tablet Commonly known as:  ZYPREXA Take 10 mg by mouth at bedtime.   omeprazole 10 MG capsule Commonly known as:  PRILOSEC Take 10 mg by mouth daily.   ribavirin 200 MG tablet Commonly known as:  COPEGUS Take 2 tablets by mouth 2 (two) times daily.    Sofosbuvir-Velpatasvir 400-100 MG Tabs Take 1 tablet by mouth daily at 8 pm.          Total Time in preparing paper work, data evaluation and todays exam - 35 minutes  Dustin Flock M.D on 04/07/2016 at 3:20 PM  [facility]   Office  [phone]

## 2016-04-07 NOTE — Discharge Instructions (Signed)

## 2016-04-14 LAB — GLUCOSE, CAPILLARY: Glucose-Capillary: 169 mg/dL — ABNORMAL HIGH (ref 65–99)

## 2016-10-04 ENCOUNTER — Inpatient Hospital Stay
Admission: EM | Admit: 2016-10-04 | Discharge: 2016-10-08 | DRG: 433 | Disposition: A | Discharge status: Home / Self Care | Payer: Medicaid Other | Attending: Internal Medicine | Admitting: Internal Medicine

## 2016-10-04 ENCOUNTER — Emergency Department: Payer: Medicaid Other

## 2016-10-04 ENCOUNTER — Encounter: Payer: Self-pay | Admitting: Internal Medicine

## 2016-10-04 ENCOUNTER — Inpatient Hospital Stay: Payer: Medicaid Other

## 2016-10-04 ENCOUNTER — Encounter: Payer: Self-pay | Admitting: Emergency Medicine

## 2016-10-04 DIAGNOSIS — K7469 Other cirrhosis of liver: Secondary | ICD-10-CM | POA: Diagnosis present

## 2016-10-04 DIAGNOSIS — R609 Edema, unspecified: Secondary | ICD-10-CM

## 2016-10-04 DIAGNOSIS — F319 Bipolar disorder, unspecified: Secondary | ICD-10-CM | POA: Diagnosis present

## 2016-10-04 DIAGNOSIS — N5089 Other specified disorders of the male genital organs: Secondary | ICD-10-CM | POA: Diagnosis present

## 2016-10-04 DIAGNOSIS — R109 Unspecified abdominal pain: Secondary | ICD-10-CM

## 2016-10-04 DIAGNOSIS — C220 Liver cell carcinoma: Secondary | ICD-10-CM | POA: Diagnosis present

## 2016-10-04 DIAGNOSIS — F17210 Nicotine dependence, cigarettes, uncomplicated: Secondary | ICD-10-CM | POA: Diagnosis present

## 2016-10-04 DIAGNOSIS — F200 Paranoid schizophrenia: Secondary | ICD-10-CM | POA: Diagnosis present

## 2016-10-04 DIAGNOSIS — D61818 Other pancytopenia: Secondary | ICD-10-CM | POA: Diagnosis present

## 2016-10-04 DIAGNOSIS — Z9119 Patient's noncompliance with other medical treatment and regimen: Secondary | ICD-10-CM | POA: Diagnosis not present

## 2016-10-04 DIAGNOSIS — B182 Chronic viral hepatitis C: Secondary | ICD-10-CM | POA: Diagnosis present

## 2016-10-04 DIAGNOSIS — R188 Other ascites: Secondary | ICD-10-CM

## 2016-10-04 DIAGNOSIS — Z923 Personal history of irradiation: Secondary | ICD-10-CM

## 2016-10-04 DIAGNOSIS — R601 Generalized edema: Secondary | ICD-10-CM | POA: Diagnosis present

## 2016-10-04 DIAGNOSIS — Z8249 Family history of ischemic heart disease and other diseases of the circulatory system: Secondary | ICD-10-CM

## 2016-10-04 DIAGNOSIS — Z9221 Personal history of antineoplastic chemotherapy: Secondary | ICD-10-CM | POA: Diagnosis not present

## 2016-10-04 DIAGNOSIS — K7460 Unspecified cirrhosis of liver: Secondary | ICD-10-CM | POA: Diagnosis not present

## 2016-10-04 DIAGNOSIS — E876 Hypokalemia: Secondary | ICD-10-CM | POA: Diagnosis present

## 2016-10-04 DIAGNOSIS — R17 Unspecified jaundice: Secondary | ICD-10-CM

## 2016-10-04 DIAGNOSIS — R600 Localized edema: Secondary | ICD-10-CM

## 2016-10-04 HISTORY — DX: Malignant neoplasm of liver, not specified as primary or secondary: C22.9

## 2016-10-04 HISTORY — DX: Malignant (primary) neoplasm, unspecified: C80.1

## 2016-10-04 LAB — BASIC METABOLIC PANEL
ANION GAP: 5 (ref 5–15)
BUN: 7 mg/dL (ref 6–20)
CHLORIDE: 111 mmol/L (ref 101–111)
CO2: 22 mmol/L (ref 22–32)
Calcium: 7.5 mg/dL — ABNORMAL LOW (ref 8.9–10.3)
Creatinine, Ser: 0.46 mg/dL — ABNORMAL LOW (ref 0.61–1.24)
GFR calc Af Amer: >60 mL/min (ref >60)
GFR calc non Af Amer: >60 mL/min (ref >60)
GLUCOSE: 182 mg/dL — AB (ref 65–99)
POTASSIUM: 3.5 mmol/L (ref 3.5–5.1)
SODIUM: 138 mmol/L (ref 135–145)

## 2016-10-04 LAB — CBC
HEMATOCRIT: 31.6 % — AB (ref 40.0–52.0)
HEMOGLOBIN: 11.1 g/dL — AB (ref 13.0–18.0)
MCH: 37.1 pg — ABNORMAL HIGH (ref 26.0–34.0)
MCHC: 35.1 g/dL (ref 32.0–36.0)
MCV: 105.7 fL — AB (ref 80.0–100.0)
Platelets: 48 10*3/uL — ABNORMAL LOW (ref 150–440)
RBC: 2.99 MIL/uL — ABNORMAL LOW (ref 4.40–5.90)
RDW: 20.8 % — AB (ref 11.5–14.5)
WBC: 3.7 10*3/uL — AB (ref 3.8–10.6)

## 2016-10-04 LAB — HEPATIC FUNCTION PANEL
ALK PHOS: 123 U/L (ref 38–126)
ALT: 21 U/L (ref 17–63)
AST: 64 U/L — AB (ref 15–41)
Albumin: 2.1 g/dL — ABNORMAL LOW (ref 3.5–5.0)
BILIRUBIN INDIRECT: 2.5 mg/dL — AB (ref 0.3–0.9)
Bilirubin, Direct: 1.5 mg/dL — ABNORMAL HIGH (ref 0.1–0.5)
TOTAL PROTEIN: 5.9 g/dL — AB (ref 6.5–8.1)
Total Bilirubin: 4 mg/dL — ABNORMAL HIGH (ref 0.3–1.2)

## 2016-10-04 LAB — TROPONIN I: Troponin I: <0.03 ng/mL (ref <0.03)

## 2016-10-04 LAB — PROTIME-INR
INR: 1.48
Prothrombin Time: 18.1 seconds — ABNORMAL HIGH (ref 11.4–15.2)

## 2016-10-04 LAB — BRAIN NATRIURETIC PEPTIDE: B Natriuretic Peptide: 23 pg/mL (ref 0.0–100.0)

## 2016-10-04 LAB — TSH: TSH: 7.006 u[IU]/mL — ABNORMAL HIGH (ref 0.350–4.500)

## 2016-10-04 MED ORDER — FUROSEMIDE 10 MG/ML IJ SOLN
40.0000 mg | Freq: Two times a day (BID) | INTRAMUSCULAR | Status: DC
  Administered 2016-10-04 – 2016-10-08 (×8): 40 mg via INTRAVENOUS
  Filled 2016-10-04 (×8): qty 4

## 2016-10-04 MED ORDER — ALBUMIN HUMAN 25 % IV SOLN
25.0000 g | Freq: Four times a day (QID) | INTRAVENOUS | Status: AC
  Administered 2016-10-04 – 2016-10-05 (×4): 25 g via INTRAVENOUS
  Filled 2016-10-04 (×3): qty 100
  Filled 2016-10-04: qty 500
  Filled 2016-10-04: qty 100

## 2016-10-04 MED ORDER — ACETAMINOPHEN 325 MG PO TABS: 650.0000 mg | ORAL_TABLET | Freq: Three times a day (TID) | ORAL | Status: DC | PRN

## 2016-10-04 MED ORDER — SODIUM CHLORIDE 0.9% FLUSH
3.0000 mL | Freq: Two times a day (BID) | INTRAVENOUS | Status: DC
  Administered 2016-10-04 – 2016-10-08 (×8): 3 mL via INTRAVENOUS

## 2016-10-04 MED ORDER — IOPAMIDOL (ISOVUE-300) INJECTION 61%
30.0000 mL | Freq: Once | INTRAVENOUS | Status: AC | PRN
  Administered 2016-10-04: 30 mL via ORAL

## 2016-10-04 MED ORDER — OXYCODONE HCL 5 MG PO TABS: 5.0000 mg | ORAL_TABLET | ORAL | Status: DC | PRN

## 2016-10-04 MED ORDER — SODIUM CHLORIDE 0.9% FLUSH: 3.0000 mL | INTRAVENOUS | Status: DC | PRN

## 2016-10-04 MED ORDER — CARVEDILOL 6.25 MG PO TABS
6.2500 mg | ORAL_TABLET | Freq: Two times a day (BID) | ORAL | Status: DC
  Administered 2016-10-04 – 2016-10-08 (×8): 6.25 mg via ORAL
  Filled 2016-10-04 (×8): qty 1

## 2016-10-04 MED ORDER — ONDANSETRON HCL 4 MG PO TABS: 4.0000 mg | ORAL_TABLET | Freq: Four times a day (QID) | ORAL | Status: DC | PRN

## 2016-10-04 MED ORDER — ONDANSETRON HCL 4 MG/2ML IJ SOLN: 4.0000 mg | Freq: Four times a day (QID) | INTRAMUSCULAR | Status: DC | PRN

## 2016-10-04 MED ORDER — SODIUM CHLORIDE 0.9 % IV SOLN: 250.0000 mL | INTRAVENOUS | Status: DC | PRN

## 2016-10-04 MED ORDER — OLANZAPINE 10 MG PO TABS
10.0000 mg | ORAL_TABLET | Freq: Every day | ORAL | Status: DC
  Administered 2016-10-04 – 2016-10-07 (×4): 10 mg via ORAL
  Filled 2016-10-04 (×6): qty 1

## 2016-10-04 MED ORDER — IOPAMIDOL (ISOVUE-300) INJECTION 61%
100.0000 mL | Freq: Once | INTRAVENOUS | Status: AC | PRN
  Administered 2016-10-04: 100 mL via INTRAVENOUS

## 2016-10-04 MED ORDER — PANTOPRAZOLE SODIUM 40 MG PO TBEC
40.0000 mg | DELAYED_RELEASE_TABLET | Freq: Every day | ORAL | Status: DC
  Administered 2016-10-05 – 2016-10-08 (×4): 40 mg via ORAL
  Filled 2016-10-04 (×4): qty 1

## 2016-10-04 MED ORDER — ACETAMINOPHEN 650 MG RE SUPP: 650.0000 mg | Freq: Three times a day (TID) | RECTAL | Status: DC | PRN

## 2016-10-04 NOTE — ED Notes (Signed)
Patient back from [place].

## 2016-10-04 NOTE — ED Triage Notes (Signed)
Bilateral leg swelling and scrotal swelling x 3 days. Had radiation for liver cancer in December 2017. No current cancer treatment.

## 2016-10-04 NOTE — ED Provider Notes (Signed)
[facility] Emergency Department Provider Note   ____________________________________________   First MD Initiated Contact with Patient 10/04/16 1138     (approximate)  I have reviewed the triage vital signs and the nursing notes.   HISTORY  Chief Complaint Leg Swelling    HPI Aaron Jacobson is a 56 y.o. male here for evaluation of leg swelling.  Patient reports that he has liver cancer, he is receiving radiation therapy and the last 3-4 days he's noticed swelling starting in his lower legs and ankles, now into his scrotum. He's felt some slight shortness of breath when laying down at night, but denies any shortness of breath or cough when sitting up. No fevers or chills  Patient has noticed his abdomen is slightly more swollen than usual, but he does have cirrhosis and reports it is not unusual for him have swelling in his abdomen. No abdominal pain  No nausea or vomiting. Does not take any fluid pills. Reports he is following regularly with radiation oncology, Dr. Orland Mustard via [facility]  Past Medical History:  Diagnosis Date  . Bipolar 1 disorder ([place])   . Cancer ([place])   . Cirrhosis ([place])   . Hep C w/ coma, chronic ([place])   . Liver cancer [facility])     Patient Active Problem List   Diagnosis Date Noted  . Hypotension 04/06/2016  . Suicide attempt by beta blocker overdose ([place]) 04/06/2016  . Depression 04/06/2016  . Liver cancer ([place]) 04/06/2016  . Bipolar 1 disorder ([place]) 04/06/2016  . Hepatitis C 04/06/2016  . Paranoid schizophrenia ([place]) 04/06/2016  . Bradycardia   . Prolonged Q-T interval on ECG     Past Surgical History:  Procedure Laterality Date  . ABDOMINAL SURGERY      Prior to Admission medications   Medication Sig Start Date End Date Taking? Authorizing Provider  ibuprofen (ADVIL,MOTRIN) 600 MG tablet Take 600 mg by mouth every 6 (six) hours as needed for moderate pain.    Historical Provider, MD  OLANZapine (ZYPREXA) 10 MG tablet Take  10 mg by mouth at bedtime.    Historical Provider, MD  omeprazole (PRILOSEC) 10 MG capsule Take 10 mg by mouth daily.    Historical Provider, MD  ribavirin (COPEGUS) 200 MG tablet Take 2 tablets by mouth 2 (two) times daily. 03/07/15   Historical Provider, MD  Sofosbuvir-Velpatasvir 400-100 MG TABS Take 1 tablet by mouth daily at 8 pm. 03/07/15   Historical Provider, MD    Allergies Patient has no known allergies.  No family history on file.  Social History Social History  Substance Use Topics  . Smoking status: Current Every Day Smoker    Packs/day: 0.10    Types: Cigarettes  . Smokeless tobacco: Never Used  . Alcohol use No    Review of Systems Constitutional: No fever/chills Eyes: No visual changes. ENT: No sore throat. Cardiovascular: Denies chest pain. Respiratory: Denies shortness of breath. Gastrointestinal: No abdominal pain.  No nausea, no vomiting.  No diarrhea.  No constipation.Reports his scrotum is swollen Genitourinary: Negative for dysuria. Musculoskeletal: Negative for back pain. Reports his legs are swollen Skin: Negative for rash. Neurological: Negative for headaches, focal weakness or numbness.  10-point ROS otherwise negative.  ____________________________________________   PHYSICAL EXAM:  VITAL SIGNS: ED Triage Vitals  Enc Vitals Group     BP 10/04/16 0941 129/72     Pulse Rate 10/04/16 0941 86     Resp 10/04/16 0941 18     Temp 10/04/16 0941  98.2 F (36.8 C)     Temp Source 10/04/16 0941 Oral     SpO2 10/04/16 0941 93 %     Weight 10/04/16 0942 186 lb (84.4 kg)     Height 10/04/16 0942 6\' 1"  (1.854 m)     Head Circumference --      Peak Flow --      Pain Score --      Pain Loc --      Pain Edu? --      Excl. in [place]? --     Constitutional: Alert and oriented. Well appearing and in no acute distress. Eyes: Conjunctivae are Minimally injected, may be a faint jaundice. PERRL. EOMI. Head: Atraumatic. Nose: No  congestion/rhinnorhea. Mouth/Throat: Mucous membranes are moist.  Oropharynx non-erythematous. Neck: No stridor.   Cardiovascular: Normal rate, regular rhythm. Grossly normal heart sounds.  Good peripheral circulation. Respiratory: Normal respiratory effort.  No retractions. Lungs CTAB. Gastrointestinal: Soft and nontender. Minimal distention consistent with mild ascites, no tense ascites. No abdominal bruits. No CVA tenderness. The scrotum and penis are moderately edematous without focal swelling or testicular tenderness.. No erection Musculoskeletal: No lower extremity tenderness but there is 3-4+ pitting edema, associated with anasarca from approximately the waist line down Neurologic:  Normal speech and language. No gross focal neurologic deficits are appreciated.  Skin:  Skin is warm, dry and intact. No rash noted. Psychiatric: Mood and affect are normal. Speech and behavior are normal.  ____________________________________________   LABS (all labs ordered are listed, but only abnormal results are displayed)  Labs Reviewed  BASIC METABOLIC PANEL - Abnormal; Notable for the following:       Result Value   Glucose, Bld 182 (*)    Creatinine, Ser 0.46 (*)    Calcium 7.5 (*)    All other components within normal limits  CBC - Abnormal; Notable for the following:    WBC 3.7 (*)    RBC 2.99 (*)    Hemoglobin 11.1 (*)    HCT 31.6 (*)    MCV 105.7 (*)    MCH 37.1 (*)    RDW 20.8 (*)    Platelets 48 (*)    All other components within normal limits  HEPATIC FUNCTION PANEL - Abnormal; Notable for the following:    Total Protein 5.9 (*)    Albumin 2.1 (*)    AST 64 (*)    Total Bilirubin 4.0 (*)    Bilirubin, Direct 1.5 (*)    Indirect Bilirubin 2.5 (*)    All other components within normal limits  TROPONIN I  BRAIN NATRIURETIC PEPTIDE  PROTIME-INR   ____________________________________________  EKG  Reviewed and interpreted by me 9:59 AM Ventricular rate 80 QRS 90 QTc  480 Normal sinus rhythm, mild prolongation of QT interval ____________________________________________  RADIOLOGY  Dg Chest 2 View  Result Date: 10/04/2016 CLINICAL DATA:  Bilateral leg swelling and scrotal swelling x 3 days. Had radiation for liver cancer in December 2017. Pt has cirrhosis and Hep C. Current everyday smoker EXAM: CHEST  2 VIEW COMPARISON:  None. FINDINGS: Metallic foreign bodies about the upper chest and left side of the neck. Midline trachea. Normal heart size and mediastinal contours. No pleural effusion or pneumothorax. Pulmonary interstitial prominence is greater on the left than right. Low lung volumes. No lobar consolidation. IMPRESSION: Asymmetric mild pulmonary interstitial thickening, greater on the left. Given the clinical history, favor mild interstitial edema. Infection, including atypical etiologies, could look similar. Short term (3-5 days) radiographic follow-up  and comparison with prior chest radiographs (if available) suggested. Electronically Signed   By: Abigail Miyamoto M.D.   On: 10/04/2016 10:26   US Venous Img Lower Bilateral  Result Date: 10/04/2016 CLINICAL DATA:  Bilateral edema x4 days EXAM: BILATERAL LOWER EXTREMITY VENOUS DOPPLER ULTRASOUND TECHNIQUE: Gray-scale sonography with compression, as well as color and duplex ultrasound, were performed to evaluate the deep venous system from the level of the common femoral vein through the popliteal and proximal calf veins. COMPARISON:  None FINDINGS: Normal compressibility of the common femoral, superficial femoral, and popliteal veins, as well as the proximal calf veins. No filling defects to suggest DVT on grayscale or color Doppler imaging. Doppler waveforms show normal direction of venous flow, normal respiratory phasicity and response to augmentation. IMPRESSION: No evidence of  lower extremity deep vein thrombosis. Electronically Signed   By: Lucrezia Europe M.D.   On: 10/04/2016 14:49   [place] Art/ven Flow Abd Pelv  Doppler Limited  Result Date: 10/04/2016 CLINICAL DATA:  Hepatocellular carcinoma . EXAM: US ABDOMEN LIMITED - RIGHT UPPER QUADRANT COMPARISON:  Ultrasound 02/21/2014. FINDINGS: Gallbladder: Previously identified tiny gallbladder polyp not identified on today's exam. Negative Murphy sign. Gallbladder wall thickness 4.4 mm. This may be from hyperproteinemia as opposed cholecystitis. Common bile duct: Diameter: 2.4 mm Liver: Liver has a nodular heterogeneous parenchymal pattern consistent with cirrhosis. 3.6 x 2.5 x 3.8 cm and 2.8 x 1.5 x 2.3 cm masses noted in the right lobe liver. These findings could represent metastatic disease. Multifocal hepatocellular carcinoma could present this fashion . Reversal portal flow appears to be present suggesting portal hypertension. Prominent ascites. IMPRESSION: 1. 3.8 cm and 2.8 cm separate masses are noted the right lobe liver. These findings could represent metastatic disease. Multifocal hepatocellular carcinoma cannot be excluded. 2. Liver again has a nodular heterogeneous parenchymal pattern consistent with cirrhosis . Reversal portal flow is appears to be present suggesting portal hypertension. 3. Previously identified tiny gallbladder polyp not identified on today's exam. Gallbladder wall thickness is 4.4 mm. This may be secondary to hyperproteinemia as opposed to cholecystitis. 4.  Prominent ascites. Electronically Signed   By: Marcello Moores  Register   On: 10/04/2016 13:47   US Abdomen Limited Ruq  Result Date: 10/04/2016 CLINICAL DATA:  Hepatocellular carcinoma . EXAM: US ABDOMEN LIMITED - RIGHT UPPER QUADRANT COMPARISON:  Ultrasound 02/21/2014. FINDINGS: Gallbladder: Previously identified tiny gallbladder polyp not identified on today's exam. Negative Murphy sign. Gallbladder wall thickness 4.4 mm. This may be from hyperproteinemia as opposed cholecystitis. Common bile duct: Diameter: 2.4 mm Liver: Liver has a nodular heterogeneous parenchymal pattern consistent with  cirrhosis. 3.6 x 2.5 x 3.8 cm and 2.8 x 1.5 x 2.3 cm masses noted in the right lobe liver. These findings could represent metastatic disease. Multifocal hepatocellular carcinoma could present this fashion . Reversal portal flow appears to be present suggesting portal hypertension. Prominent ascites. IMPRESSION: 1. 3.8 cm and 2.8 cm separate masses are noted the right lobe liver. These findings could represent metastatic disease. Multifocal hepatocellular carcinoma cannot be excluded. 2. Liver again has a nodular heterogeneous parenchymal pattern consistent with cirrhosis . Reversal portal flow is appears to be present suggesting portal hypertension. 3. Previously identified tiny gallbladder polyp not identified on today's exam. Gallbladder wall thickness is 4.4 mm. This may be secondary to hyperproteinemia as opposed to cholecystitis. 4.  Prominent ascites. Electronically Signed   By: Marcello Moores  Register   On: 10/04/2016 13:47    ____________________________________________   PROCEDURES  Procedure(s) performed: None  Procedures  Critical Care performed: No  ____________________________________________   INITIAL IMPRESSION / ASSESSMENT AND PLAN / ED COURSE  Pertinent labs & imaging results that were available during my care of the patient were reviewed by me and considered in my medical decision making (see chart for details).    Labs notable for pancytopenia. Appears consistent with previous. No acute abnormality.  Chest x-ray findings, considering the patient's dyspnea when he lays flat at night as well as apparent anasarca appear most consistent with congestive heart failure/mild pulmonary edema due to volume overload likely secondary to liver disease. Exclude DVT via ultrasound, also abdominal survey to evaluate for possible venous thrombus within the hepatic system    Clinical Course as of Oct 04 1452  Mon Oct 04, 2016  1314 Case discussed with Dr. Allen Norris.   [MQ]    Clinical Course  User Index [MQ] Delman Kitten, MD    ----------------------------------------- 2:55 PM on 10/04/2016 -----------------------------------------  Patient REVIEWED, CASE DISCUSSED WITH DR. Verl Blalock. Patient will be admitted for diuresis, further evaluation and optimization of therapy. He does demonstrate what is probably mild pulmonary interstitial edema, anasarca and general volume overload today. ____________________________________________   FINAL CLINICAL IMPRESSION(S) / ED DIAGNOSES  Final diagnoses:  Anasarca  Peripheral edema  Total bilirubin, elevated      NEW MEDICATIONS STARTED DURING THIS VISIT:  New Prescriptions   No medications on file     Note:  This document was prepared using Dragon voice recognition software and may include unintentional dictation errors.     Delman Kitten, MD 10/04/16 [phone]

## 2016-10-04 NOTE — H&P (Signed)
[name] NAME: Aaron Jacobson    MR#:  [id number]  DATE OF BIRTH:  April 12, 1961  DATE OF ADMISSION:  10/04/2016  PRIMARY CARE PHYSICIAN: [name]   REQUESTING/REFERRING PHYSICIAN: Quale MD  CHIEF COMPLAINT:   Chief Complaint  Patient presents with  . Leg Swelling    HISTORY OF PRESENT ILLNESS: Aaron Jacobson  is a 56 y.o. male with a known history of bipolar 1 disorder, liver cancer, cirrhosis, hepc, And some paranoid schizophrenia who usually follows at [facility] for his liver cancer presents to the ED with complaint of swelling of his lower extremity abdomen and scrotum. Patient reports that the abdominal swelling has gotten worst over the past few months. He reports he will see her at [facility] last in December at that time had radiation therapy. Patient denies any chest pains fevers chills or shortness of breath. PAST MEDICAL HISTORY:   Past Medical History:  Diagnosis Date  . Bipolar 1 disorder ([place])   . Cancer ([place])   . Cirrhosis ([place])   . Hep C w/ coma, chronic ([place])   . Liver cancer ([place])   . Paranoid schizophrenia ([place])   . Suicide attempt     PAST SURGICAL HISTORY:  Past Surgical History:  Procedure Laterality Date  . ABDOMINAL SURGERY      SOCIAL HISTORY:  Social History  Substance Use Topics  . Smoking status: Current Every Day Smoker    Packs/day: 0.10    Types: Cigarettes  . Smokeless tobacco: Never Used  . Alcohol use No    FAMILY HISTORY:  Family History  Problem Relation Age of Onset  . Hypertension Mother     DRUG ALLERGIES: No Known Allergies  REVIEW OF SYSTEMS:   CONSTITUTIONAL: No fever, fatigue or weakness.  EYES: No blurred or double vision.  EARS, NOSE, AND THROAT: No tinnitus or ear pain.  RESPIRATORY: No cough, shortness of breath, wheezing or hemoptysis.  CARDIOVASCULAR: No chest pain, orthopnea, edema.  GASTROINTESTINAL: No nausea, vomiting, diarrhea or abdominal pain. Positive for  abdominal distention GENITOURINARY: No dysuria, hematuria.  ENDOCRINE: No polyuria, nocturia,  HEMATOLOGY: No anemia, easy bruising or bleeding SKIN: No rash or lesion. Positive lower extremity edema MUSCULOSKELETAL: No joint pain or arthritis.   NEUROLOGIC: No tingling, numbness, weakness.  PSYCHIATRY: No anxiety or depression.   MEDICATIONS AT HOME:  Prior to Admission medications   Medication Sig Start Date End Date Taking? Authorizing Provider  ibuprofen (ADVIL,MOTRIN) 600 MG tablet Take 600 mg by mouth every 6 (six) hours as needed for moderate pain.    Historical Provider, MD  OLANZapine (ZYPREXA) 10 MG tablet Take 10 mg by mouth at bedtime.    Historical Provider, MD  omeprazole (PRILOSEC) 10 MG capsule Take 10 mg by mouth daily.    Historical Provider, MD  ribavirin (COPEGUS) 200 MG tablet Take 2 tablets by mouth 2 (two) times daily. 03/07/15   Historical Provider, MD  Sofosbuvir-Velpatasvir 400-100 MG TABS Take 1 tablet by mouth daily at 8 pm. 03/07/15   Historical Provider, MD      PHYSICAL EXAMINATION:   VITAL SIGNS: Blood pressure 126/83, pulse 76, temperature 98.2 F (36.8 C), temperature source Oral, resp. rate (!) 24, height 6\' 1"  (1.854 m), weight 186 lb (84.4 kg), SpO2 96 %.  GENERAL:  56 y.o.-year-old patient lying in the bed with no acute distress.  EYES: Pupils equal, round, reactive to light and accommodation. No scleral icterus. Extraocular muscles  intact.  HEENT: Head atraumatic, normocephalic. Oropharynx and nasopharynx clear.  NECK:  Supple, no jugular venous distention. No thyroid enlargement, no tenderness.  LUNGS: Normal breath sounds bilaterally, no wheezing, rales,rhonchi or crepitation. No use of accessory muscles of respiration.  CARDIOVASCULAR: S1, S2 normal. No murmurs, rubs, or gallops.  ABDOMEN: Soft, nontender, Distended with ascites. Bowel sounds present. No organomegaly or mass.  EXTREMITIES: 2+ pedal edema, cyanosis, or clubbing.  NEUROLOGIC:  Cranial nerves II through XII are intact. Muscle strength 5/5 in all extremities. Sensation intact. Gait not checked.  PSYCHIATRIC: The patient is alert and oriented x 3.  SKIN: No obvious rash, lesion, or ulcer.   LABORATORY PANEL:   CBC  Recent Labs Lab 10/04/16 0953  WBC 3.7*  HGB 11.1*  HCT 31.6*  PLT 48*  MCV 105.7*  MCH 37.1*  MCHC 35.1  RDW 20.8*   ------------------------------------------------------------------------------------------------------------------  Chemistries   Recent Labs Lab 10/04/16 0953  NA 138  K 3.5  CL 111  CO2 22  GLUCOSE 182*  BUN 7  CREATININE 0.46*  CALCIUM 7.5*  AST 64*  ALT 21  ALKPHOS 123  BILITOT 4.0*   ------------------------------------------------------------------------------------------------------------------ estimated creatinine clearance is 119.3 mL/min (by C-G formula based on SCr of 0.46 mg/dL (L)). ------------------------------------------------------------------------------------------------------------------ No results for input(s): TSH, T4TOTAL, T3FREE, THYROIDAB in the last 72 hours.  Invalid input(s): FREET3   Coagulation profile  Recent Labs Lab 10/04/16 1442  INR 1.48   ------------------------------------------------------------------------------------------------------------------- No results for input(s): DDIMER in the last 72 hours. -------------------------------------------------------------------------------------------------------------------  Cardiac Enzymes  Recent Labs Lab 10/04/16 0953  TROPONINI <0.03   ------------------------------------------------------------------------------------------------------------------ Invalid input(s): POCBNP  ---------------------------------------------------------------------------------------------------------------  Urinalysis    Component Value Date/Time   COLORURINE Amber 05/10/2014 1038   APPEARANCEUR Hazy 05/10/2014 1038   LABSPEC  1.029 05/10/2014 1038   PHURINE 6.0 05/10/2014 1038   GLUCOSEU Negative 05/10/2014 1038   HGBUR 1+ 05/10/2014 1038   BILIRUBINUR Negative 05/10/2014 1038   KETONESUR Trace 05/10/2014 1038   PROTEINUR 100 mg/dL 05/10/2014 1038   NITRITE Negative 05/10/2014 1038   LEUKOCYTESUR Negative 05/10/2014 1038     RADIOLOGY: Dg Chest 2 View  Result Date: 10/04/2016 CLINICAL DATA:  Bilateral leg swelling and scrotal swelling x 3 days. Had radiation for liver cancer in December 2017. Pt has cirrhosis and Hep C. Current everyday smoker EXAM: CHEST  2 VIEW COMPARISON:  None. FINDINGS: Metallic foreign bodies about the upper chest and left side of the neck. Midline trachea. Normal heart size and mediastinal contours. No pleural effusion or pneumothorax. Pulmonary interstitial prominence is greater on the left than right. Low lung volumes. No lobar consolidation. IMPRESSION: Asymmetric mild pulmonary interstitial thickening, greater on the left. Given the clinical history, favor mild interstitial edema. Infection, including atypical etiologies, could look similar. Short term (3-5 days) radiographic follow-up and comparison with prior chest radiographs (if available) suggested. Electronically Signed   By: Abigail Miyamoto M.D.   On: 10/04/2016 10:26   US Venous Img Lower Bilateral  Result Date: 10/04/2016 CLINICAL DATA:  Bilateral edema x4 days EXAM: BILATERAL LOWER EXTREMITY VENOUS DOPPLER ULTRASOUND TECHNIQUE: Gray-scale sonography with compression, as well as color and duplex ultrasound, were performed to evaluate the deep venous system from the level of the common femoral vein through the popliteal and proximal calf veins. COMPARISON:  None FINDINGS: Normal compressibility of the common femoral, superficial femoral, and popliteal veins, as well as the proximal calf veins. No filling defects to suggest DVT on grayscale or color Doppler imaging. Doppler waveforms  show normal direction of venous flow, normal  respiratory phasicity and response to augmentation. IMPRESSION: No evidence of  lower extremity deep vein thrombosis. Electronically Signed   By: Lucrezia Europe M.D.   On: 10/04/2016 14:49   [place] Art/ven Flow Abd Pelv Doppler Limited  Result Date: 10/04/2016 CLINICAL DATA:  Hepatocellular carcinoma . EXAM: US ABDOMEN LIMITED - RIGHT UPPER QUADRANT COMPARISON:  Ultrasound 02/21/2014. FINDINGS: Gallbladder: Previously identified tiny gallbladder polyp not identified on today's exam. Negative Murphy sign. Gallbladder wall thickness 4.4 mm. This may be from hyperproteinemia as opposed cholecystitis. Common bile duct: Diameter: 2.4 mm Liver: Liver has a nodular heterogeneous parenchymal pattern consistent with cirrhosis. 3.6 x 2.5 x 3.8 cm and 2.8 x 1.5 x 2.3 cm masses noted in the right lobe liver. These findings could represent metastatic disease. Multifocal hepatocellular carcinoma could present this fashion . Reversal portal flow appears to be present suggesting portal hypertension. Prominent ascites. IMPRESSION: 1. 3.8 cm and 2.8 cm separate masses are noted the right lobe liver. These findings could represent metastatic disease. Multifocal hepatocellular carcinoma cannot be excluded. 2. Liver again has a nodular heterogeneous parenchymal pattern consistent with cirrhosis . Reversal portal flow is appears to be present suggesting portal hypertension. 3. Previously identified tiny gallbladder polyp not identified on today's exam. Gallbladder wall thickness is 4.4 mm. This may be secondary to hyperproteinemia as opposed to cholecystitis. 4.  Prominent ascites. Electronically Signed   By: Marcello Moores  Register   On: 10/04/2016 13:47   US Abdomen Limited Ruq  Result Date: 10/04/2016 CLINICAL DATA:  Hepatocellular carcinoma . EXAM: US ABDOMEN LIMITED - RIGHT UPPER QUADRANT COMPARISON:  Ultrasound 02/21/2014. FINDINGS: Gallbladder: Previously identified tiny gallbladder polyp not identified on today's exam. Negative Murphy  sign. Gallbladder wall thickness 4.4 mm. This may be from hyperproteinemia as opposed cholecystitis. Common bile duct: Diameter: 2.4 mm Liver: Liver has a nodular heterogeneous parenchymal pattern consistent with cirrhosis. 3.6 x 2.5 x 3.8 cm and 2.8 x 1.5 x 2.3 cm masses noted in the right lobe liver. These findings could represent metastatic disease. Multifocal hepatocellular carcinoma could present this fashion . Reversal portal flow appears to be present suggesting portal hypertension. Prominent ascites. IMPRESSION: 1. 3.8 cm and 2.8 cm separate masses are noted the right lobe liver. These findings could represent metastatic disease. Multifocal hepatocellular carcinoma cannot be excluded. 2. Liver again has a nodular heterogeneous parenchymal pattern consistent with cirrhosis . Reversal portal flow is appears to be present suggesting portal hypertension. 3. Previously identified tiny gallbladder polyp not identified on today's exam. Gallbladder wall thickness is 4.4 mm. This may be secondary to hyperproteinemia as opposed to cholecystitis. 4.  Prominent ascites. Electronically Signed   By: Marcello Moores  Register   On: 10/04/2016 13:47    EKG: Orders placed or performed during the hospital encounter of 10/04/16  . ED EKG within 10 minutes  . ED EKG within 10 minutes    IMPRESSION AND PLAN: Patient is a 56 year old white male with history of liver cancer presents with anasarca and abdominal distention  1. Anasarca with ascites Suspect due to progression of his liver cirrhosis and/or progression of his liver cancer I will obtain a CT scan of the abdomen to further evaluate I will ask radiology to drain his ascites with fluid sampling I will place him on IV Lasix Patient's albumin level is 2.1 we will give him IV albumin for 4 doses  2. Liver cirrhosis associated with liver cancer After CT of the abdomen is performed which  can ask oncology to see patient has been noncompliant following up with [facility]  oncology  3. Bipolar disorder and schizophrenia continue his home regimen  4. Nicotine abuse smoking cessation provided recommended patient stop smoking 4 minutes spent nicotine replacement will be started    All the records are reviewed and case discussed with ED provider. Management plans discussed with the patient, family and they are in agreement.  CODE STATUS: Code Status History    Date Active Date Inactive Code Status Order ID Comments User Context   04/06/2016  1:44 AM 04/07/2016  7:12 PM Full Code [id number]  Harvie Bridge, DO Inpatient       TOTAL TIME TAKING CARE OF THIS PATIENT: 12minutes.    Dustin Flock M.D on 10/04/2016 at 3:21 PM  Between 7am to 6pm - Pager - [phone]  After 6pm go to [email or web address] - password [email or web address] [facility]  [facility]  Office  [phone]  CC: Primary care physician; [name]

## 2016-10-04 NOTE — ED Triage Notes (Signed)
Pt reports bilateral leg swelling and scrotal swelling since Thursday, Pt reports recent radiation for liver cancer.

## 2016-10-05 ENCOUNTER — Inpatient Hospital Stay: Payer: Medicaid Other

## 2016-10-05 DIAGNOSIS — D61818 Other pancytopenia: Secondary | ICD-10-CM

## 2016-10-05 DIAGNOSIS — C220 Liver cell carcinoma: Secondary | ICD-10-CM

## 2016-10-05 DIAGNOSIS — N5089 Other specified disorders of the male genital organs: Secondary | ICD-10-CM

## 2016-10-05 DIAGNOSIS — K7460 Unspecified cirrhosis of liver: Secondary | ICD-10-CM

## 2016-10-05 DIAGNOSIS — R188 Other ascites: Secondary | ICD-10-CM

## 2016-10-05 LAB — CBC
HEMATOCRIT: 28.5 % — AB (ref 40.0–52.0)
HEMOGLOBIN: 9.7 g/dL — AB (ref 13.0–18.0)
MCH: 36.1 pg — ABNORMAL HIGH (ref 26.0–34.0)
MCHC: 34.1 g/dL (ref 32.0–36.0)
MCV: 105.7 fL — AB (ref 80.0–100.0)
Platelets: 40 10*3/uL — ABNORMAL LOW (ref 150–440)
RBC: 2.7 MIL/uL — ABNORMAL LOW (ref 4.40–5.90)
RDW: 20.9 % — AB (ref 11.5–14.5)
WBC: 2.5 10*3/uL — ABNORMAL LOW (ref 3.8–10.6)

## 2016-10-05 LAB — BODY FLUID CELL COUNT WITH DIFFERENTIAL
EOS FL: 0 %
LYMPHS FL: 12 %
MONOCYTE-MACROPHAGE-SEROUS FLUID: 71 %
Neutrophil Count, Fluid: 15 %
WBC FLUID: 205 uL

## 2016-10-05 LAB — PROTEIN, BODY FLUID: Total protein, fluid: <3 g/dL

## 2016-10-05 LAB — ALBUMIN, FLUID (OTHER)

## 2016-10-05 LAB — COMPREHENSIVE METABOLIC PANEL
ALBUMIN: 2.6 g/dL — AB (ref 3.5–5.0)
ALK PHOS: 110 U/L (ref 38–126)
ALT: 19 U/L (ref 17–63)
ANION GAP: 5 (ref 5–15)
AST: 57 U/L — AB (ref 15–41)
BILIRUBIN TOTAL: 3 mg/dL — AB (ref 0.3–1.2)
BUN: 7 mg/dL (ref 6–20)
CALCIUM: 7.7 mg/dL — AB (ref 8.9–10.3)
CO2: 25 mmol/L (ref 22–32)
Chloride: 108 mmol/L (ref 101–111)
Creatinine, Ser: 0.46 mg/dL — ABNORMAL LOW (ref 0.61–1.24)
GFR calc Af Amer: >60 mL/min (ref >60)
GLUCOSE: 104 mg/dL — AB (ref 65–99)
Potassium: 3.2 mmol/L — ABNORMAL LOW (ref 3.5–5.1)
Sodium: 138 mmol/L (ref 135–145)
TOTAL PROTEIN: 5.9 g/dL — AB (ref 6.5–8.1)

## 2016-10-05 MED ORDER — POTASSIUM CHLORIDE CRYS ER 20 MEQ PO TBCR
20.0000 meq | EXTENDED_RELEASE_TABLET | Freq: Two times a day (BID) | ORAL | Status: DC
  Administered 2016-10-05 – 2016-10-08 (×6): 20 meq via ORAL
  Filled 2016-10-05 (×6): qty 1

## 2016-10-05 MED ORDER — POTASSIUM CHLORIDE CRYS ER 20 MEQ PO TBCR
20.0000 meq | EXTENDED_RELEASE_TABLET | Freq: Once | ORAL | Status: AC
  Administered 2016-10-05: 20 meq via ORAL
  Filled 2016-10-05: qty 1

## 2016-10-05 NOTE — Progress Notes (Signed)
Family Meeting Note  Advance Directive:no  Today a meeting took place with the Patient.  The following clinical team members were present during this meeting:MD  The following were discussed:Patient's diagnosis: , Patient's progosis: > 12 months and Goals for treatment: Full Code  Additional follow-up to be provided: Continue goals of care discussion at outpt visit with Onco and PCP  Time spent during discussion:20 minutes  Max Sane, MD

## 2016-10-05 NOTE — Progress Notes (Signed)
[facility] NAME: Lamarian Anstey    MR#:  [id number]  DATE OF BIRTH:  May 17, 1961  SUBJECTIVE:  CHIEF COMPLAINT:   Chief Complaint  Patient presents with  . Leg Swelling  worries about his swelling - scrotum is what he's most worried about, but also has significant abd and leg swelling. Says he can't have MRI as he's had multiple pallets on his face/neck  REVIEW OF SYSTEMS:  Review of Systems  Constitutional: Positive for malaise/fatigue and weight loss. Negative for chills and fever.  HENT: Negative for nosebleeds and sore throat.   Eyes: Negative for blurred vision.  Respiratory: Negative for cough, shortness of breath and wheezing.   Cardiovascular: Positive for leg swelling. Negative for chest pain, orthopnea and PND.  Gastrointestinal: Negative for abdominal pain, constipation, diarrhea, heartburn, nausea and vomiting.  Genitourinary: Negative for dysuria and urgency.  Musculoskeletal: Negative for back pain.  Skin: Negative for rash.  Neurological: Positive for weakness. Negative for dizziness, speech change, focal weakness and headaches.  Endo/Heme/Allergies: Does not bruise/bleed easily.  Psychiatric/Behavioral: Negative for depression.   DRUG ALLERGIES:  No Known Allergies VITALS:  Blood pressure 118/61, pulse 84, temperature 98.4 F (36.9 C), temperature source Oral, resp. rate 20, height 6\' 1"  (1.854 m), weight 95.8 kg (211 lb 3 oz), SpO2 91 %. PHYSICAL EXAMINATION:  Physical Exam  Constitutional: He is oriented to person, place, and time. Vital signs are normal. He appears malnourished. He appears unhealthy. He appears toxic.  HENT:  Head: Normocephalic and atraumatic.  Eyes: Conjunctivae and EOM are normal. Pupils are equal, round, and reactive to light.  Neck: Normal range of motion. Neck supple. No tracheal deviation present. No thyromegaly present.  Cardiovascular: Normal rate, regular rhythm and normal heart  sounds.   Pulmonary/Chest: Effort normal and breath sounds normal. No respiratory distress. He has no wheezes. He exhibits no tenderness.  Abdominal: Soft. Bowel sounds are normal. He exhibits distension, fluid wave and ascites. There is generalized tenderness.  Genitourinary:  Genitourinary Comments: scrotal swelling  Musculoskeletal: Normal range of motion. He exhibits edema (3+ bl LE).  Neurological: He is alert and oriented to person, place, and time. No cranial nerve deficit.  Skin: Skin is warm and dry. No rash noted.  Psychiatric: Mood and affect normal.   LABORATORY PANEL:   CBC  Recent Labs Lab 10/05/16 0451  WBC 2.5*  HGB 9.7*  HCT 28.5*  PLT 40*   ------------------------------------------------------------------------------------------------------------------ Chemistries   Recent Labs Lab 10/05/16 0451  NA 138  K 3.2*  CL 108  CO2 25  GLUCOSE 104*  BUN 7  CREATININE 0.46*  CALCIUM 7.7*  AST 57*  ALT 19  ALKPHOS 110  BILITOT 3.0*   RADIOLOGY:  Dg Chest 2 View  Result Date: 10/04/2016 CLINICAL DATA:  Bilateral leg swelling and scrotal swelling x 3 days. Had radiation for liver cancer in December 2017. Pt has cirrhosis and Hep C. Current everyday smoker EXAM: CHEST  2 VIEW COMPARISON:  None. FINDINGS: Metallic foreign bodies about the upper chest and left side of the neck. Midline trachea. Normal heart size and mediastinal contours. No pleural effusion or pneumothorax. Pulmonary interstitial prominence is greater on the left than right. Low lung volumes. No lobar consolidation. IMPRESSION: Asymmetric mild pulmonary interstitial thickening, greater on the left. Given the clinical history, favor mild interstitial edema. Infection, including atypical etiologies, could look similar. Short term (3-5 days) radiographic follow-up and comparison with prior chest radiographs (  if available) suggested. Electronically Signed   By: Abigail Miyamoto M.D.   On: 10/04/2016 10:26    Ct Abdomen Pelvis W Contrast  Result Date: 10/04/2016 CLINICAL DATA:  Worsening abdominal distention and scrotal swelling. EXAM: CT ABDOMEN AND PELVIS WITH CONTRAST TECHNIQUE: Multidetector CT imaging of the abdomen and pelvis was performed using the standard protocol following bolus administration of intravenous contrast. CONTRAST:  171mL ISOVUE-300 IOPAMIDOL (ISOVUE-300) INJECTION 61% COMPARISON:  None. FINDINGS: Lower chest: There is a small left pleural effusion identified. No pericardial effusion. Hepatobiliary: The liver appears cirrhotic. 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 Ill defined areas of low attenuation within the right lobe of liver are identified. Index lesion measures approximately 4.3 x 4.7 cm, image 24 of series 2. Along the dome of liver there is a focal area of low attenuation measuring 5 cm, image number 10 of series 7. Gallbladder is unremarkable. Pancreas: Unremarkable. No pancreatic ductal dilatation or surrounding inflammatory changes. Spleen: The spleen is enlarged measuring 18.1 cm in length. There is a wedge-shaped area of low attenuation along the dome of the spleen, image 83 of series 5. This is suspicious for splenic infarct. Adrenals/Urinary Tract: Unremarkable appearance of the adrenal glands. No focal kidney abnormality identified. There is no hydronephrosis or mass. The urinary bladder is unremarkable. Stomach/Bowel: The stomach is normal. No pathologic dilatation of the small or large bowel loops. Vascular/Lymphatic: Aortic atherosclerosis. Upper abdominal varicosities are identified including a large splenorenal shunt. Large perigastric varices are identified, image 24 of series 2. Re- cannulization of the umbilical vein noted. No upper abdominal or pelvic adenopathy. No inguinal adenopathy. There is no upper abdominal or pelvic adenopathy. Reproductive: The prostate gland is unremarkable. Other: There is a marked amount  of abdominal and pelvic ascites. No focal fluid collections identified. Musculoskeletal: Degenerative disc disease noted within the lumbar spine. IMPRESSION: 1. Morphologic feature the liver compatible with cirrhosis. There is stigmata of portal venous hypertension including splenomegaly, ascites and upper abdominal varicosities. 2. The ill defined liver lesions compatible with suspected multifocal hepatocellular carcinoma are difficult to assess with arterial phase imaging. Consider further investigation with liver protocol MRI for more definitive assessment of extent of hepatocellular carcinoma. 3. 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 4. Splenic infarct. 5. Aortic atherosclerosis. Electronically Signed   By: Kerby Moors M.D.   On: 10/04/2016 16:11   US Venous Img Lower Bilateral  Result Date: 10/04/2016 CLINICAL DATA:  Bilateral edema x4 days EXAM: BILATERAL LOWER EXTREMITY VENOUS DOPPLER ULTRASOUND TECHNIQUE: Gray-scale sonography with compression, as well as color and duplex ultrasound, were performed to evaluate the deep venous system from the level of the common femoral vein through the popliteal and proximal calf veins. COMPARISON:  None FINDINGS: Normal compressibility of the common femoral, superficial femoral, and popliteal veins, as well as the proximal calf veins. No filling defects to suggest DVT on grayscale or color Doppler imaging. Doppler waveforms show normal direction of venous flow, normal respiratory phasicity and response to augmentation. IMPRESSION: No evidence of  lower extremity deep vein thrombosis. Electronically Signed   By: Lucrezia Europe M.D.   On: 10/04/2016 14:49   [place] Art/ven Flow Abd Pelv Doppler Limited  Result Date: 10/04/2016 CLINICAL DATA:  Hepatocellular carcinoma . EXAM: US ABDOMEN LIMITED - RIGHT UPPER QUADRANT COMPARISON:  Ultrasound 02/21/2014. FINDINGS: Gallbladder: Previously  identified tiny gallbladder polyp  not identified on today's exam. Negative Murphy sign. Gallbladder wall thickness 4.4 mm. This may be from hyperproteinemia as opposed cholecystitis. Common bile duct: Diameter: 2.4 mm Liver: Liver has a nodular heterogeneous parenchymal pattern consistent with cirrhosis. 3.6 x 2.5 x 3.8 cm and 2.8 x 1.5 x 2.3 cm masses noted in the right lobe liver. These findings could represent metastatic disease. Multifocal hepatocellular carcinoma could present this fashion . Reversal portal flow appears to be present suggesting portal hypertension. Prominent ascites. IMPRESSION: 1. 3.8 cm and 2.8 cm separate masses are noted the right lobe liver. These findings could represent metastatic disease. Multifocal hepatocellular carcinoma cannot be excluded. 2. Liver again has a nodular heterogeneous parenchymal pattern consistent with cirrhosis . Reversal portal flow is appears to be present suggesting portal hypertension. 3. Previously identified tiny gallbladder polyp not identified on today's exam. Gallbladder wall thickness is 4.4 mm. This may be secondary to hyperproteinemia as opposed to cholecystitis. 4.  Prominent ascites. Electronically Signed   By: Marcello Moores  Register   On: 10/04/2016 13:47   US Abdomen Limited Ruq  Result Date: 10/04/2016 CLINICAL DATA:  Hepatocellular carcinoma . EXAM: US ABDOMEN LIMITED - RIGHT UPPER QUADRANT COMPARISON:  Ultrasound 02/21/2014. FINDINGS: Gallbladder: Previously identified tiny gallbladder polyp not identified on today's exam. Negative Murphy sign. Gallbladder wall thickness 4.4 mm. This may be from hyperproteinemia as opposed cholecystitis. Common bile duct: Diameter: 2.4 mm Liver: Liver has a nodular heterogeneous parenchymal pattern consistent with cirrhosis. 3.6 x 2.5 x 3.8 cm and 2.8 x 1.5 x 2.3 cm masses noted in the right lobe liver. These findings could represent metastatic disease. Multifocal hepatocellular carcinoma could present this fashion .  Reversal portal flow appears to be present suggesting portal hypertension. Prominent ascites. IMPRESSION: 1. 3.8 cm and 2.8 cm separate masses are noted the right lobe liver. These findings could represent metastatic disease. Multifocal hepatocellular carcinoma cannot be excluded. 2. Liver again has a nodular heterogeneous parenchymal pattern consistent with cirrhosis . Reversal portal flow is appears to be present suggesting portal hypertension. 3. Previously identified tiny gallbladder polyp not identified on today's exam. Gallbladder wall thickness is 4.4 mm. This may be secondary to hyperproteinemia as opposed to cholecystitis. 4.  Prominent ascites. Electronically Signed   By: Marcello Moores  Register   On: 10/04/2016 13:47   ASSESSMENT AND PLAN:  Patient is a 56 year old white male with history of liver cancer presents with anasarca and abdominal distention  * Anasarca with ascites, scrotal edema - Suspect due to progression of his liver cirrhosis and/or progression of his liver cancer - US guided paracentesis today - Continue IV Lasix - elevated scrotum - towels underneath while in bed  * Pancytopenia - likely due to underlying liver cancer and chemo/radiation  - Monitor  * Hypokalemia - replete and recheck  * Liver cirrhosis associated with liver cancer - can't have MRI due to pallets in body - Onco c/s - d/w Dr Janese Banks who will see him  * Bipolar disorder and schizophrenia - continue zyprexa  * Nicotine abuse: smoking cessation provided by Admitting Dr. recommended patient stop smoking 4 minutes spent nicotine replacement will be started     All the records are reviewed and case discussed with Care Management/Social Worker. Management plans discussed with the patient, family and they are in agreement.  CODE STATUS: FULL CODE  TOTAL TIME TAKING CARE OF THIS PATIENT: 35 minutes.   More than 50% of the time was spent in counseling/coordination of care: YES  POSSIBLE D/C IN  1-2  DAYS, DEPENDING ON CLINICAL CONDITION.   Max Sane M.D on 10/05/2016 at 10:05 AM  Between 7am to 6pm - Pager - [phone]  After 6pm go to [email or web address] - Technical brewer [facility]  Office  [phone]  CC: Primary care physician; [name]  Note: This dictation was prepared with Dragon dictation along with smaller phrase technology. Any transcriptional errors that result from this process are unintentional.

## 2016-10-05 NOTE — Care Management (Signed)
Admitted to this facility with the diagnosis of anasarca. Lives alone. Moved to [facility] from [place] December 2014. Sister is Aaron Jacobson, she lives in [place] [phone]). Last seen Dr. William Hamburger at [facility] November 2017. Sees Drs Wynona Meals, [place], and Daring in [place] for his liver cancer. No home health, skilled nursing, or oxygen in the home. Prescriptions are filled at [facility]. A peer support specialist helps with transportation. No falls. Good appetite. Takes care of all basic and instrumental activities of daily living himself.  Wants to get his cancer treatment at [facility] here. Shelbie Ammons RN MSN CCM Care Management

## 2016-10-05 NOTE — Consult Note (Signed)
Hematology/Oncology Consult note [facility] Telephone:([phone] [phone]  Patient Care Team: [name] as PCP - General (General Practice)   Name of the patient: Aaron Jacobson  [id number]  25-Apr-1961    Reason for referral- h/o [facility]   Requesting physician- Dr. Max Sane  Date of visit: 10/05/2016  History of presenting illness- Patient is a 56 year old male with a history of hepatocellular carcinomaand sees Dr. Altamease Oiler at [facility] and get all his cancer care there.he also has a history of bipolar disorder and hepatitis C and cirrhosis.His cancer history is as follows: On 11/2014 RFA of solitary [place] 2. 07/2015 recurrent UNOS T2 (2 cm, 1.8 cm) HCC, 09/2015 TACE aborted because of portal vein thrombosis - canceled multiple appts because didn't want to get treatment. He was last seen by her in September 2017.patient has not received any systemic therapy for his [facility] and underwent intrahepatic localization of radiotracer with Y 19 microsphere administration  Patient was admitted yesterday with symptoms of lower extremity swelling as well as ascites and scrotal swelling. Patient admits to drinking alcohol occasionally. Last drink was 4 days ago. Patient had ultrasound-guided paracentesis done today and 5 L of peritoneal fluid was removed  CT abdomen done yesterday showed: Morphologic feature the liver compatible with cirrhosis. There is stigmata of portal venous hypertension including splenomegaly, ascites and upper abdominal varicosities. 2. The ill defined liver lesions compatible with suspected multifocal hepatocellular carcinoma are difficult to assess with arterial phase imaging. Consider further investigation with liver protocol MRI for more definitive assessment of extent of hepatocellular carcinoma. 3. 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 4. Splenic infarct. 5.  Aortic atherosclerosis.    ECOG PS- 2  Pain scale- 3   Review of systems- Review of Systems  Constitutional: Positive for malaise/fatigue. Negative for chills, fever and weight loss.  HENT: Negative for congestion, ear discharge and nosebleeds.   Eyes: Negative for blurred vision.  Respiratory: Negative for cough, hemoptysis, sputum production, shortness of breath and wheezing.   Cardiovascular: Negative for chest pain, palpitations, orthopnea and claudication.  Gastrointestinal: Positive for abdominal pain. Negative for blood in stool, constipation, diarrhea, heartburn, melena, nausea and vomiting.       Ascites and scrotal swelling  Genitourinary: Negative for dysuria, flank pain, frequency, hematuria and urgency.  Musculoskeletal: Negative for back pain, joint pain and myalgias.       Leg swelling +  Skin: Negative for rash.  Neurological: Negative for dizziness, tingling, focal weakness, seizures, weakness and headaches.  Endo/Heme/Allergies: Does not bruise/bleed easily.  Psychiatric/Behavioral: Negative for depression and suicidal ideas. The patient does not have insomnia.     No Known Allergies  Patient Active Problem List   Diagnosis Date Noted  . Anasarca 10/04/2016  . Hypotension 04/06/2016  . Suicide attempt by beta blocker overdose ([place]) 04/06/2016  . Depression 04/06/2016  . Liver cancer ([place]) 04/06/2016  . Bipolar 1 disorder ([place]) 04/06/2016  . Hepatitis C 04/06/2016  . Paranoid schizophrenia ([place]) 04/06/2016  . Bradycardia   . Prolonged Q-T interval on ECG      Past Medical History:  Diagnosis Date  . Bipolar 1 disorder ([place])   . Cancer ([place])   . Cirrhosis ([place])   . Hep C w/ coma, chronic ([place])   . Liver cancer ([place])   . Paranoid schizophrenia ([place])   . Suicide attempt      Past  Surgical History:  Procedure Laterality Date  . ABDOMINAL SURGERY      Social History   Social History  . Marital status: Single    Spouse name: N/A  . Number  of children: N/A  . Years of education: N/A   Occupational History  . Not on file.   Social History Main Topics  . Smoking status: Current Every Day Smoker    Packs/day: 0.10    Types: Cigarettes  . Smokeless tobacco: Never Used  . Alcohol use No  . Drug use: No  . Sexual activity: No   Other Topics Concern  . Not on file   Social History Narrative  . No narrative on file     Family History  Problem Relation Age of Onset  . Hypertension Mother      Current Facility-Administered Medications:  .  0.9 %  sodium chloride infusion, 250 mL, Intravenous, PRN, Dustin Flock, MD .  acetaminophen (TYLENOL) tablet 650 mg, 650 mg, Oral, Q8H PRN **OR** acetaminophen (TYLENOL) suppository 650 mg, 650 mg, Rectal, Q8H PRN, Dustin Flock, MD .  carvedilol (COREG) tablet 6.25 mg, 6.25 mg, Oral, BID WC, Dustin Flock, MD, 6.25 mg at 10/05/16 1153 .  furosemide (LASIX) injection 40 mg, 40 mg, Intravenous, Q12H, Dustin Flock, MD, 40 mg at 10/05/16 0345 .  OLANZapine (ZYPREXA) tablet 10 mg, 10 mg, Oral, QHS, Dustin Flock, MD, 10 mg at 10/04/16 2336 .  ondansetron (ZOFRAN) tablet 4 mg, 4 mg, Oral, Q6H PRN **OR** ondansetron (ZOFRAN) injection 4 mg, 4 mg, Intravenous, Q6H PRN, Dustin Flock, MD .  oxyCODONE (Oxy IR/ROXICODONE) immediate release tablet 5 mg, 5 mg, Oral, Q4H PRN, Dustin Flock, MD .  pantoprazole (PROTONIX) EC tablet 40 mg, 40 mg, Oral, Daily, Dustin Flock, MD, 40 mg at 10/05/16 1153 .  sodium chloride flush (NS) 0.9 % injection 3 mL, 3 mL, Intravenous, Q12H, Dustin Flock, MD, 3 mL at 10/05/16 1154 .  sodium chloride flush (NS) 0.9 % injection 3 mL, 3 mL, Intravenous, PRN, Dustin Flock, MD   Physical exam:  Vitals:   10/05/16 1047 10/05/16 1120 10/05/16 1135 10/05/16 1256  BP: 124/71 119/66 117/67 (!) 107/56  Pulse: 79 81 80 79  Resp: 20 18 18 18   Temp:   97.5 F (36.4 C) 98.1 F (36.7 C)  TempSrc:   Oral Oral  SpO2: 96% 97% 98% 99%  Weight:      Height:        Physical Exam  Constitutional: He is oriented to person, place, and time.  Fatigued, in no acute distress  HENT:  Head: Normocephalic and atraumatic.  Eyes: EOM are normal. Pupils are equal, round, and reactive to light.  Neck: Normal range of motion.  Cardiovascular: Normal rate, regular rhythm and normal heart sounds.   Pulmonary/Chest: Effort normal and breath sounds normal.  Abdominal:  Firm, distended, ascites +, scrotal edema  Musculoskeletal: He exhibits edema (b/l + 2 edema).  Neurological: He is alert and oriented to person, place, and time.  Skin: Skin is warm and dry.       CMP Latest Ref Rng & Units 10/05/2016  Glucose 65 - 99 mg/dL 104(H)  BUN 6 - 20 mg/dL 7  Creatinine 0.61 - 1.24 mg/dL 0.46(L)  Sodium 135 - 145 mmol/L 138  Potassium 3.5 - 5.1 mmol/L 3.2(L)  Chloride 101 - 111 mmol/L 108  CO2 22 - 32 mmol/L 25  Calcium 8.9 - 10.3 mg/dL 7.7(L)  Total Protein 6.5 - 8.1 g/dL 5.9(L)  Total Bilirubin 0.3 - 1.2 mg/dL 3.0(H)  Alkaline Phos 38 - 126 U/L 110  AST 15 - 41 U/L 57(H)  ALT 17 - 63 U/L 19   CBC Latest Ref Rng & Units 10/05/2016  WBC 3.8 - 10.6 K/uL 2.5(L)  Hemoglobin 13.0 - 18.0 g/dL 9.7(L)  Hematocrit 40.0 - 52.0 % 28.5(L)  Platelets 150 - 440 K/uL 40(L)    @IMAGES @  Dg Chest 2 View  Result Date: 10/04/2016 CLINICAL DATA:  Bilateral leg swelling and scrotal swelling x 3 days. Had radiation for liver cancer in December 2017. Pt has cirrhosis and Hep C. Current everyday smoker EXAM: CHEST  2 VIEW COMPARISON:  None. FINDINGS: Metallic foreign bodies about the upper chest and left side of the neck. Midline trachea. Normal heart size and mediastinal contours. No pleural effusion or pneumothorax. Pulmonary interstitial prominence is greater on the left than right. Low lung volumes. No lobar consolidation. IMPRESSION: Asymmetric mild pulmonary interstitial thickening, greater on the left. Given the clinical history, favor mild interstitial edema. Infection,  including atypical etiologies, could look similar. Short term (3-5 days) radiographic follow-up and comparison with prior chest radiographs (if available) suggested. Electronically Signed   By: Abigail Miyamoto M.D.   On: 10/04/2016 10:26   Ct Abdomen Pelvis W Contrast  Result Date: 10/04/2016 CLINICAL DATA:  Worsening abdominal distention and scrotal swelling. EXAM: CT ABDOMEN AND PELVIS WITH CONTRAST TECHNIQUE: Multidetector CT imaging of the abdomen and pelvis was performed using the standard protocol following bolus administration of intravenous contrast. CONTRAST:  168mL ISOVUE-300 IOPAMIDOL (ISOVUE-300) INJECTION 61% COMPARISON:  None. FINDINGS: Lower chest: There is a small left pleural effusion identified. No pericardial effusion. Hepatobiliary: The liver appears cirrhotic. 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 Ill defined areas of low attenuation within the right lobe of liver are identified. Index lesion measures approximately 4.3 x 4.7 cm, image 24 of series 2. Along the dome of liver there is a focal area of low attenuation measuring 5 cm, image number 10 of series 7. Gallbladder is unremarkable. Pancreas: Unremarkable. No pancreatic ductal dilatation or surrounding inflammatory changes. Spleen: The spleen is enlarged measuring 18.1 cm in length. There is a wedge-shaped area of low attenuation along the dome of the spleen, image 83 of series 5. This is suspicious for splenic infarct. Adrenals/Urinary Tract: Unremarkable appearance of the adrenal glands. No focal kidney abnormality identified. There is no hydronephrosis or mass. The urinary bladder is unremarkable. Stomach/Bowel: The stomach is normal. No pathologic dilatation of the small or large bowel loops. Vascular/Lymphatic: Aortic atherosclerosis. Upper abdominal varicosities are identified including a large splenorenal shunt. Large perigastric varices are identified, image 24 of series 2. Re-  cannulization of the umbilical vein noted. No upper abdominal or pelvic adenopathy. No inguinal adenopathy. There is no upper abdominal or pelvic adenopathy. Reproductive: The prostate gland is unremarkable. Other: There is a marked amount of abdominal and pelvic ascites. No focal fluid collections identified. Musculoskeletal: Degenerative disc disease noted within the lumbar spine. IMPRESSION: 1. Morphologic feature the liver compatible with cirrhosis. There is stigmata of portal venous hypertension including splenomegaly, ascites and upper abdominal varicosities. 2. The ill defined liver lesions compatible with suspected multifocal hepatocellular carcinoma are difficult to assess with arterial phase imaging. Consider further investigation with liver protocol MRI for more definitive assessment of extent of hepatocellular carcinoma. 3. There is thrombosis of the portal vein to the right lobe of liver which may reflect bland  thrombosis versus tumor thrombus. 4. Splenic infarct. 5. Aortic atherosclerosis. Electronically Signed   By: Kerby Moors M.D.   On: 10/04/2016 16:11   US Venous Img Lower Bilateral  Result Date: 10/04/2016 CLINICAL DATA:  Bilateral edema x4 days EXAM: BILATERAL LOWER EXTREMITY VENOUS DOPPLER ULTRASOUND TECHNIQUE: Gray-scale sonography with compression, as well as color and duplex ultrasound, were performed to evaluate the deep venous system from the level of the common femoral vein through the popliteal and proximal calf veins. COMPARISON:  None FINDINGS: Normal compressibility of the common femoral, superficial femoral, and popliteal veins, as well as the proximal calf veins. No filling defects to suggest DVT on grayscale or color Doppler imaging. Doppler waveforms show normal direction of venous flow, normal respiratory phasicity and response to augmentation. IMPRESSION: No evidence of  lower extremity deep vein thrombosis. Electronically Signed   By: Lucrezia Europe M.D.   On: 10/04/2016  14:49   [place] Art/ven Flow Abd Pelv Doppler Limited  Result Date: 10/04/2016 CLINICAL DATA:  Hepatocellular carcinoma . EXAM: US ABDOMEN LIMITED - RIGHT UPPER QUADRANT COMPARISON:  Ultrasound 02/21/2014. FINDINGS: Gallbladder: Previously identified tiny gallbladder polyp not identified on today's exam. Negative Murphy sign. Gallbladder wall thickness 4.4 mm. This may be from hyperproteinemia as opposed cholecystitis. Common bile duct: Diameter: 2.4 mm Liver: Liver has a nodular heterogeneous parenchymal pattern consistent with cirrhosis. 3.6 x 2.5 x 3.8 cm and 2.8 x 1.5 x 2.3 cm masses noted in the right lobe liver. These findings could represent metastatic disease. Multifocal hepatocellular carcinoma could present this fashion . Reversal portal flow appears to be present suggesting portal hypertension. Prominent ascites. IMPRESSION: 1. 3.8 cm and 2.8 cm separate masses are noted the right lobe liver. These findings could represent metastatic disease. Multifocal hepatocellular carcinoma cannot be excluded. 2. Liver again has a nodular heterogeneous parenchymal pattern consistent with cirrhosis . Reversal portal flow is appears to be present suggesting portal hypertension. 3. Previously identified tiny gallbladder polyp not identified on today's exam. Gallbladder wall thickness is 4.4 mm. This may be secondary to hyperproteinemia as opposed to cholecystitis. 4.  Prominent ascites. Electronically Signed   By: Marcello Moores  Register   On: 10/04/2016 13:47   US Paracentesis  Result Date: 10/05/2016 INDICATION: 56 year old male with symptomatic large volume ascites, cirrhosis and hepatocellular carcinoma. He presents for first paracentesis. EXAM: ULTRASOUND GUIDED  PARACENTESIS MEDICATIONS: None. COMPLICATIONS: None immediate. PROCEDURE: Informed written consent was obtained from the patient after a discussion of the risks, benefits and alternatives to treatment. A timeout was performed prior to the initiation of the  procedure. Initial ultrasound scanning demonstrates a large amount of ascites within the right lower abdominal quadrant. The right lower abdomen was prepped and draped in the usual sterile fashion. 1% lidocaine with epinephrine was used for local anesthesia. Following this, a 6 Fr Safe-T-Centesis catheter was introduced. An ultrasound image was saved for documentation purposes. The paracentesis was performed. The catheter was removed and a dressing was applied. The patient tolerated the procedure well without immediate post procedural complication. FINDINGS: A total of approximately 5000 mL of amber colored fluid was removed. Samples were sent to the laboratory as requested by the clinical team. IMPRESSION: Successful ultrasound-guided paracentesis yielding 5 liters of peritoneal fluid. Electronically Signed   By: Jacqulynn Cadet M.D.   On: 10/05/2016 11:36   US Abdomen Limited Ruq  Result Date: 10/04/2016 CLINICAL DATA:  Hepatocellular carcinoma . EXAM: US ABDOMEN LIMITED - RIGHT UPPER QUADRANT COMPARISON:  Ultrasound 02/21/2014. FINDINGS: Gallbladder: Previously  identified tiny gallbladder polyp not identified on today's exam. Negative Murphy sign. Gallbladder wall thickness 4.4 mm. This may be from hyperproteinemia as opposed cholecystitis. Common bile duct: Diameter: 2.4 mm Liver: Liver has a nodular heterogeneous parenchymal pattern consistent with cirrhosis. 3.6 x 2.5 x 3.8 cm and 2.8 x 1.5 x 2.3 cm masses noted in the right lobe liver. These findings could represent metastatic disease. Multifocal hepatocellular carcinoma could present this fashion . Reversal portal flow appears to be present suggesting portal hypertension. Prominent ascites. IMPRESSION: 1. 3.8 cm and 2.8 cm separate masses are noted the right lobe liver. These findings could represent metastatic disease. Multifocal hepatocellular carcinoma cannot be excluded. 2. Liver again has a nodular heterogeneous parenchymal pattern consistent with  cirrhosis . Reversal portal flow is appears to be present suggesting portal hypertension. 3. Previously identified tiny gallbladder polyp not identified on today's exam. Gallbladder wall thickness is 4.4 mm. This may be secondary to hyperproteinemia as opposed to cholecystitis. 4.  Prominent ascites. Electronically Signed   By: Marcello Moores  Register   On: 10/04/2016 13:47    Assessment and plan- Patient is a 56 y.o. male with a history of hepatocellular carcinoma and gets his care at [facility]. He is s/p TARE procedure in December 2017 admitted for ascites and scrotal edema likely from acute decompensated ascites in the setting of known liver cirrhosis  1. At this point I recommend that after his acute medical issues are taken care of he should continue to follow up with [facility] for further management of his [facility]. 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  2. Pancytopenia- likely from chronic liver disease. They have been low in the past as well. No acute intervention at this time   3. Patient would like to get subsequent ascites tap here at [facility]. This could be coordinated with IR by [facility].   We will be available if there are any questions or concerns from an oncology standpoint   Thank you for this kind referral and the opportunity to participate in the care of this patient   Visit Diagnosis 1. Anasarca   2. Hepatocellular carcinoma ([place])   3. Peripheral edema   4. Total bilirubin, elevated   5. Ascites   6. Abdominal pain     Dr. Randa Evens, MD, MPH [facility] at [facility] Pager- [phone] 10/05/2016 2:37 PM

## 2016-10-06 LAB — CBC
HEMATOCRIT: 26 % — AB (ref 40.0–52.0)
Hemoglobin: 8.9 g/dL — ABNORMAL LOW (ref 13.0–18.0)
MCH: 36.3 pg — ABNORMAL HIGH (ref 26.0–34.0)
MCHC: 34.2 g/dL (ref 32.0–36.0)
MCV: 106.2 fL — AB (ref 80.0–100.0)
PLATELETS: 34 10*3/uL — AB (ref 150–440)
RBC: 2.45 MIL/uL — AB (ref 4.40–5.90)
RDW: 20.3 % — ABNORMAL HIGH (ref 11.5–14.5)
WBC: 1.7 10*3/uL — AB (ref 3.8–10.6)

## 2016-10-06 LAB — COMPREHENSIVE METABOLIC PANEL
ALBUMIN: 2.5 g/dL — AB (ref 3.5–5.0)
ALT: 16 U/L — AB (ref 17–63)
AST: 47 U/L — AB (ref 15–41)
Alkaline Phosphatase: 94 U/L (ref 38–126)
Anion gap: 4 — ABNORMAL LOW (ref 5–15)
BILIRUBIN TOTAL: 2.5 mg/dL — AB (ref 0.3–1.2)
BUN: 8 mg/dL (ref 6–20)
CHLORIDE: 110 mmol/L (ref 101–111)
CO2: 25 mmol/L (ref 22–32)
CREATININE: 0.46 mg/dL — AB (ref 0.61–1.24)
Calcium: 7.6 mg/dL — ABNORMAL LOW (ref 8.9–10.3)
GFR calc Af Amer: >60 mL/min (ref >60)
GLUCOSE: 100 mg/dL — AB (ref 65–99)
Potassium: 3.5 mmol/L (ref 3.5–5.1)
Sodium: 139 mmol/L (ref 135–145)
Total Protein: 5.2 g/dL — ABNORMAL LOW (ref 6.5–8.1)

## 2016-10-06 LAB — MISC LABCORP TEST (SEND OUT): Labcorp test code: 19588

## 2016-10-06 LAB — CYTOLOGY - NON PAP

## 2016-10-06 MED ORDER — ALBUMIN HUMAN 25 % IV SOLN
12.5000 g | Freq: Four times a day (QID) | INTRAVENOUS | Status: AC
  Administered 2016-10-06 (×3): 12.5 g via INTRAVENOUS
  Filled 2016-10-06 (×3): qty 50

## 2016-10-06 NOTE — Progress Notes (Signed)
Aaron Jacobson NAME: Aaron Jacobson    MR#:  [id number]  DATE OF BIRTH:  04-19-1961  SUBJECTIVE:  CHIEF COMPLAINT:   Chief Complaint  Patient presents with  . Leg Swelling    still has swelling in leg and scrotal swelling  REVIEW OF SYSTEMS:  Review of Systems  Constitutional: Positive for malaise/fatigue and weight loss. Negative for chills and fever.  HENT: Negative for nosebleeds and sore throat.   Eyes: Negative for blurred vision.  Respiratory: Negative for cough, shortness of breath and wheezing.   Cardiovascular: Positive for leg swelling. Negative for chest pain, orthopnea and PND.  Gastrointestinal: Negative for abdominal pain, constipation, diarrhea, heartburn, nausea and vomiting.  Genitourinary: Negative for dysuria and urgency.  Musculoskeletal: Negative for back pain.  Skin: Negative for rash.  Neurological: Positive for weakness. Negative for dizziness, speech change, focal weakness and headaches.  Endo/Heme/Allergies: Does not bruise/bleed easily.  Psychiatric/Behavioral: Negative for depression.   DRUG ALLERGIES:  No Known Allergies VITALS:  Blood pressure (!) 113/57, pulse 80, temperature 98 F (36.7 C), temperature source Oral, resp. rate 18, height 6\' 1"  (1.854 m), weight 211 lb 3 oz (95.8 kg), SpO2 97 %. PHYSICAL EXAMINATION:  Physical Exam  Constitutional: He is oriented to person, place, and time. Vital signs are normal. He appears malnourished. He appears unhealthy. He appears toxic.  HENT:  Head: Normocephalic and atraumatic.  Eyes: Conjunctivae and EOM are normal. Pupils are equal, round, and reactive to light.  Neck: Normal range of motion. Neck supple. No tracheal deviation present. No thyromegaly present.  Cardiovascular: Normal rate, regular rhythm and normal heart sounds.   Pulmonary/Chest: Effort normal and breath sounds normal. No respiratory distress. He has no wheezes. He exhibits no  tenderness.  Abdominal: Soft. Bowel sounds are normal. He exhibits distension, fluid wave and ascites. There is generalized tenderness.  Genitourinary:  Genitourinary Comments: scrotal swelling  Musculoskeletal: Normal range of motion. He exhibits edema (3+ bl LE).  Neurological: He is alert and oriented to person, place, and time. No cranial nerve deficit.  Skin: Skin is warm and dry. No rash noted.  Psychiatric: Mood and affect normal.   LABORATORY PANEL:   CBC  Recent Labs Lab 10/06/16 0531  WBC 1.7*  HGB 8.9*  HCT 26.0*  PLT 34*   ------------------------------------------------------------------------------------------------------------------ Chemistries   Recent Labs Lab 10/06/16 0531  NA 139  K 3.5  CL 110  CO2 25  GLUCOSE 100*  BUN 8  CREATININE 0.46*  CALCIUM 7.6*  AST 47*  ALT 16*  ALKPHOS 94  BILITOT 2.5*   RADIOLOGY:  No results found. ASSESSMENT AND PLAN:  Patient is a 56 year old white male with history of liver cancer presents with anasarca and abdominal distention  * Anasarca with ascites, scrotal edema - Suspect due to progression of his liver cirrhosis and/or progression of his liver cancer - s/p thoracentesis - Continue IV Lasix - albumin iv  * Pancytopenia - likely due to underlying liver cancer and chemo/radiation  - Monitor  * Hypokalemia - replete and recheck  * Liver cirrhosis associated with liver cancer - Seen by oncology they recommend follow-[facility] at [facility]  * Bipolar disorder and schizophrenia - continue zyprexa  * Nicotine abuse: smoking cessation provided by me     All the records are reviewed and case discussed with Care Management/Social Worker. Management plans discussed with the patient, family and they are in agreement.  CODE STATUS: FULL CODE  TOTAL TIME TAKING CARE OF THIS PATIENT: 32 minutes.   More than 50% of the time was spent in counseling/coordination of care: YES  POSSIBLE D/C IN 1-2 DAYS,  DEPENDING ON CLINICAL CONDITION.   Dustin Flock M.D on 10/06/2016 at 4:09 PM  Between 7am to 6pm - Pager - [phone]  After 6pm go to [email or web address] - [employer] [facility]  Office  [phone]  CC: Primary care physician; [name]  Note: This dictation was prepared with Dragon dictation along with smaller phrase technology. Any transcriptional errors that result from this process are unintentional.

## 2016-10-07 LAB — BASIC METABOLIC PANEL
ANION GAP: 5 (ref 5–15)
BUN: 7 mg/dL (ref 6–20)
CO2: 25 mmol/L (ref 22–32)
Calcium: 8 mg/dL — ABNORMAL LOW (ref 8.9–10.3)
Chloride: 111 mmol/L (ref 101–111)
Creatinine, Ser: 0.57 mg/dL — ABNORMAL LOW (ref 0.61–1.24)
Glucose, Bld: 101 mg/dL — ABNORMAL HIGH (ref 65–99)
POTASSIUM: 3.3 mmol/L — AB (ref 3.5–5.1)
SODIUM: 141 mmol/L (ref 135–145)

## 2016-10-07 MED ORDER — SPIRONOLACTONE 25 MG PO TABS
50.0000 mg | ORAL_TABLET | Freq: Every day | ORAL | Status: DC
  Administered 2016-10-07 – 2016-10-08 (×2): 50 mg via ORAL
  Filled 2016-10-07 (×2): qty 2

## 2016-10-07 MED ORDER — POTASSIUM CHLORIDE CRYS ER 20 MEQ PO TBCR
40.0000 meq | EXTENDED_RELEASE_TABLET | Freq: Once | ORAL | Status: AC
  Administered 2016-10-07: 11:00:00 40 meq via ORAL
  Filled 2016-10-07: qty 2

## 2016-10-07 NOTE — Plan of Care (Signed)
Problem: Health Behavior/Discharge Planning: Goal: Ability to manage health-related needs will improve Outcome: Progressing Scrotum decreasing in size from edema. Voiding from  Diuretics given. bm today.  Up in room.

## 2016-10-07 NOTE — Progress Notes (Signed)
Aaron Jacobson at [place] NAME: Aaron Jacobson    MR#:  [id number]  DATE OF BIRTH:  [birth year]  SUBJECTIVE:  CHIEF COMPLAINT:   Chief Complaint  Patient presents with  . Leg Swelling    scrotal swelling Swelling in the leg is improved as well  REVIEW OF SYSTEMS:  Review of Systems  Constitutional: Positive for malaise/fatigue and weight loss. Negative for chills and fever.  HENT: Negative for nosebleeds and sore throat.   Eyes: Negative for blurred vision.  Respiratory: Negative for cough, shortness of breath and wheezing.   Cardiovascular: Positive for leg swelling. Negative for chest pain, orthopnea and PND.  Gastrointestinal: Negative for abdominal pain, constipation, diarrhea, heartburn, nausea and vomiting.  Genitourinary: Negative for dysuria and urgency.  Musculoskeletal: Negative for back pain.  Skin: Negative for rash.  Neurological: Positive for weakness. Negative for dizziness, speech change, focal weakness and headaches.  Endo/Heme/Allergies: Does not bruise/bleed easily.  Psychiatric/Behavioral: Negative for depression.   DRUG ALLERGIES:  No Known Allergies VITALS:  Blood pressure 119/61, pulse 76, temperature 97.6 F (36.4 C), temperature source Oral, resp. rate 16, height 6\' 1"  (1.854 m), weight 211 lb 3 oz (95.8 kg), SpO2 97 %. PHYSICAL EXAMINATION:  Physical Exam  Constitutional: He is oriented to person, place, and time. Vital signs are normal. He appears malnourished. He appears unhealthy. He appears toxic.  HENT:  Head: Normocephalic and atraumatic.  Eyes: Conjunctivae and EOM are normal. Pupils are equal, round, and reactive to light.  Neck: Normal range of motion. Neck supple. No tracheal deviation present. No thyromegaly present.  Cardiovascular: Normal rate, regular rhythm and normal heart sounds.   Pulmonary/Chest: Effort normal and breath sounds normal. No respiratory distress. He has no wheezes. He exhibits no  tenderness.  Abdominal: Soft. Bowel sounds are normal. He exhibits distension, fluid wave and ascites. There is generalized tenderness.  Genitourinary:  Genitourinary Comments: scrotal swelling  Musculoskeletal: Normal range of motion. He exhibits edema (3+ bl LE).       Arms: Neurological: He is alert and oriented to person, place, and time. No cranial nerve deficit.  Skin: Skin is warm and dry. No rash noted.  Psychiatric: Mood and affect normal.   LABORATORY PANEL:   CBC  Recent Labs Lab 10/06/16 0531  WBC 1.7*  HGB 8.9*  HCT 26.0*  PLT 34*   ------------------------------------------------------------------------------------------------------------------ Chemistries   Recent Labs Lab 10/06/16 0531 10/07/16 0500  NA 139 141  K 3.5 3.3*  CL 110 111  CO2 25 25  GLUCOSE 100* 101*  BUN 8 7  CREATININE 0.46* 0.57*  CALCIUM 7.6* 8.0*  AST 47*  --   ALT 16*  --   ALKPHOS 94  --   BILITOT 2.5*  --    RADIOLOGY:  No results found. ASSESSMENT AND PLAN:  Patient is a 56 year old white male with history of liver cancer presents with anasarca and abdominal distention  * Anasarca with ascites, scrotal edema - Suspect due to progression of his liver cirrhosis and/or progression of his liver cancer - s/p thoracentesis --Swelling decreased I will add spinal lactone continue IV Lasix one more day  * Pancytopenia - likely due to underlying liver cancer and chemo/radiation  - Monitor  * Hypokalemia - I will give him dose of potassium in addition to what is scheduled to take   * Liver cirrhosis associated with liver cancer - Seen by oncology they recommend follow-[facility] at [facility]  * Bipolar disorder  and schizophrenia - continue zyprexa  * Nicotine abuse: smoking cessation provided by me     All the records are reviewed and case discussed with Care Management/Social Worker. Management plans discussed with the patient, family and they are in agreement.  CODE STATUS:  FULL CODE  TOTAL TIME TAKING CARE OF THIS PATIENT: 58minutes.   More than 50% of the time was spent in counseling/coordination of care: YES  POSSIBLE D/C IN 1-2 DAYS, DEPENDING ON CLINICAL CONDITION.   Dustin Flock M.D on 10/07/2016 at 2:11 PM  Between 7am to 6pm - Pager - [phone]  After 6pm go to [email or web address] - [employer] [facility]  Office  [phone]  CC: Primary care physician; [name]  Note: This dictation was prepared with Dragon dictation along with smaller phrase technology. Any transcriptional errors that result from this process are unintentional.

## 2016-10-08 LAB — BODY FLUID CULTURE: Culture: NO GROWTH

## 2016-10-08 LAB — BASIC METABOLIC PANEL
ANION GAP: 5 (ref 5–15)
BUN: 7 mg/dL (ref 6–20)
CALCIUM: 7.7 mg/dL — AB (ref 8.9–10.3)
CO2: 23 mmol/L (ref 22–32)
Chloride: 112 mmol/L — ABNORMAL HIGH (ref 101–111)
Creatinine, Ser: 0.54 mg/dL — ABNORMAL LOW (ref 0.61–1.24)
GFR calc Af Amer: >60 mL/min (ref >60)
GFR calc non Af Amer: >60 mL/min (ref >60)
GLUCOSE: 98 mg/dL (ref 65–99)
POTASSIUM: 3.8 mmol/L (ref 3.5–5.1)
Sodium: 140 mmol/L (ref 135–145)

## 2016-10-08 MED ORDER — FUROSEMIDE 40 MG PO TABS: 40.0000 mg | ORAL_TABLET | Freq: Two times a day (BID) | ORAL | 0 refills | Status: DC

## 2016-10-08 MED ORDER — SPIRONOLACTONE 50 MG PO TABS: 50.0000 mg | ORAL_TABLET | Freq: Every day | ORAL | 0 refills | Status: DC

## 2016-10-08 NOTE — Discharge Instructions (Signed)
[facility] - [place] at [facility] ° °DIET:  °Diabetic diet ° °DISCHARGE CONDITION:  °Stable ° °ACTIVITY:  °Activity as tolerated ° °OXYGEN:  °Home Oxygen: No. °  °Oxygen Delivery: room air ° °DISCHARGE LOCATION:  °home  ° ° °ADDITIONAL DISCHARGE INSTRUCTION: ° ° °If you experience worsening of your admission symptoms, develop shortness of breath, life threatening emergency, suicidal or homicidal thoughts you must seek medical attention immediately by calling 911 or calling your MD immediately  if symptoms less severe. ° °You Must read complete instructions/literature along with all the possible adverse reactions/side effects for all the Medicines you take and that have been prescribed to you. Take any new Medicines after you have completely understood and accpet all the possible adverse reactions/side effects.  ° °Please note ° °You were cared for by a hospitalist during your hospital stay. If you have any questions about your discharge medications or the care you received while you were in the hospital after you are discharged, you can call the unit and asked to speak with the hospitalist on call if the hospitalist that took care of you is not available. Once you are discharged, your primary care physician will handle any further medical issues. Please note that NO REFILLS for any discharge medications will be authorized once you are discharged, as it is imperative that you return to your primary care physician (or establish a relationship with a primary care physician if you do not have one) for your aftercare needs so that they can reassess your need for medications and monitor your lab values. ° ° °

## 2016-10-08 NOTE — Discharge Summary (Signed)
[place] at [place], 56 y.o., DOB 1961/07/30, MRN [id number]. Admission date: 10/04/2016 Discharge Date 10/08/2016 Primary MD [name] Admitting Physician Dustin Flock, MD  Admission Diagnosis  Anasarca [R60.1] Hepatocellular carcinoma ([place]) [C22.0] Peripheral edema [R60.9] Total bilirubin, elevated [R17] Ascites [R18.8] Abdominal pain [R10.9]  Discharge Diagnosis   Active Problems: Anasarca due to hypoalbunemia, liver cirrhosis Ascites due to liver cirrhosis Liver cirrhosis due to hepatitis C Pancytopenia   Bipolar disorder Liver cancer Paranoid schizophrenia      Hospital Course Aaron Jacobson  is a 56 y.o. male with a known history of bipolar 1 disorder, liver cancer, cirrhosis, hepc, And some paranoid schizophrenia who usually follows at [facility] for his liver cancer presents to the ED with complaint of swelling of his lower extremity abdomen and scrotum. Patient was noted to have ascites. He was admitted for his anasarca and ascites. He underwent a paracentesis. He also was diuresed and received multiple doses of albumin with significant reduction in his anasarca. Patient does have a history of hepatocellular cancer. He was seen by oncology who recommended follow up with [facility]. Patient has been seen there before and has been noncompliant with follow-up. I encouraged him to follow up there. I also encouraged him to take his diuretic medication to prevent readmission. His primary care provider needs to evaluate him for paracentesis.            Consults  none  Significant Tests:  See full reports for all details     Dg Chest 2 View  Result Date: 10/04/2016 CLINICAL DATA:  Bilateral leg swelling and scrotal swelling x 3 days. Had radiation for liver cancer in December 2017. Pt has cirrhosis and Hep C. Current everyday smoker EXAM: CHEST  2 VIEW COMPARISON:  None. FINDINGS: Metallic foreign bodies about the upper chest and left  side of the neck. Midline trachea. Normal heart size and mediastinal contours. No pleural effusion or pneumothorax. Pulmonary interstitial prominence is greater on the left than right. Low lung volumes. No lobar consolidation. IMPRESSION: Asymmetric mild pulmonary interstitial thickening, greater on the left. Given the clinical history, favor mild interstitial edema. Infection, including atypical etiologies, could look similar. Short term (3-5 days) radiographic follow-up and comparison with prior chest radiographs (if available) suggested. Electronically Signed   By: Abigail Miyamoto M.D.   On: 10/04/2016 10:26   Ct Abdomen Pelvis W Contrast  Result Date: 10/04/2016 CLINICAL DATA:  Worsening abdominal distention and scrotal swelling. EXAM: CT ABDOMEN AND PELVIS WITH CONTRAST TECHNIQUE: Multidetector CT imaging of the abdomen and pelvis was performed using the standard protocol following bolus administration of intravenous contrast. CONTRAST:  125mL ISOVUE-300 IOPAMIDOL (ISOVUE-300) INJECTION 61% COMPARISON:  None. FINDINGS: Lower chest: There is a small left pleural effusion identified. No pericardial effusion. Hepatobiliary: The liver appears cirrhotic. 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 Ill defined areas of low attenuation within the right lobe of liver are identified. Index lesion measures approximately 4.3 x 4.7 cm, image 24 of series 2. Along the dome of liver there is a focal area of low attenuation measuring 5 cm, image number 10 of series 7. Gallbladder is unremarkable. Pancreas: Unremarkable. No pancreatic ductal dilatation or surrounding inflammatory changes. Spleen: The spleen is enlarged measuring 18.1 cm in length. There is a wedge-shaped area of low attenuation along the dome of the spleen, image 83 of series 5. This is suspicious for splenic infarct.  Adrenals/Urinary Tract: Unremarkable appearance of the adrenal glands. No focal kidney abnormality  identified. There is no hydronephrosis or mass. The urinary bladder is unremarkable. Stomach/Bowel: The stomach is normal. No pathologic dilatation of the small or large bowel loops. Vascular/Lymphatic: Aortic atherosclerosis. Upper abdominal varicosities are identified including a large splenorenal shunt. Large perigastric varices are identified, image 24 of series 2. Re- cannulization of the umbilical vein noted. No upper abdominal or pelvic adenopathy. No inguinal adenopathy. There is no upper abdominal or pelvic adenopathy. Reproductive: The prostate gland is unremarkable. Other: There is a marked amount of abdominal and pelvic ascites. No focal fluid collections identified. Musculoskeletal: Degenerative disc disease noted within the lumbar spine. IMPRESSION: 1. Morphologic feature the liver compatible with cirrhosis. There is stigmata of portal venous hypertension including splenomegaly, ascites and upper abdominal varicosities. 2. The ill defined liver lesions compatible with suspected multifocal hepatocellular carcinoma are difficult to assess with arterial phase imaging. Consider further investigation with liver protocol MRI for more definitive assessment of extent of hepatocellular carcinoma. 3. 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 4. Splenic infarct. 5. Aortic atherosclerosis. Electronically Signed   By: Kerby Moors M.D.   On: 10/04/2016 16:11   US Venous Img Lower Bilateral  Result Date: 10/04/2016 CLINICAL DATA:  Bilateral edema x4 days EXAM: BILATERAL LOWER EXTREMITY VENOUS DOPPLER ULTRASOUND TECHNIQUE: Gray-scale sonography with compression, as well as color and duplex ultrasound, were performed to evaluate the deep venous system from the level of the common femoral vein through the popliteal and proximal calf veins. COMPARISON:  None FINDINGS: Normal compressibility of the common femoral, superficial femoral,  and popliteal veins, as well as the proximal calf veins. No filling defects to suggest DVT on grayscale or color Doppler imaging. Doppler waveforms show normal direction of venous flow, normal respiratory phasicity and response to augmentation. IMPRESSION: No evidence of  lower extremity deep vein thrombosis. Electronically Signed   By: Lucrezia Europe M.D.   On: 10/04/2016 14:49   [place] Art/ven Flow Abd Pelv Doppler Limited  Result Date: 10/04/2016 CLINICAL DATA:  Hepatocellular carcinoma . EXAM: US ABDOMEN LIMITED - RIGHT UPPER QUADRANT COMPARISON:  Ultrasound 02/21/2014. FINDINGS: Gallbladder: Previously identified tiny gallbladder polyp not identified on today's exam. Negative Murphy sign. Gallbladder wall thickness 4.4 mm. This may be from hyperproteinemia as opposed cholecystitis. Common bile duct: Diameter: 2.4 mm Liver: Liver has a nodular heterogeneous parenchymal pattern consistent with cirrhosis. 3.6 x 2.5 x 3.8 cm and 2.8 x 1.5 x 2.3 cm masses noted in the right lobe liver. These findings could represent metastatic disease. Multifocal hepatocellular carcinoma could present this fashion . Reversal portal flow appears to be present suggesting portal hypertension. Prominent ascites. IMPRESSION: 1. 3.8 cm and 2.8 cm separate masses are noted the right lobe liver. These findings could represent metastatic disease. Multifocal hepatocellular carcinoma cannot be excluded. 2. Liver again has a nodular heterogeneous parenchymal pattern consistent with cirrhosis . Reversal portal flow is appears to be present suggesting portal hypertension. 3. Previously identified tiny gallbladder polyp not identified on today's exam. Gallbladder wall thickness is 4.4 mm. This may be secondary to hyperproteinemia as opposed to cholecystitis. 4.  Prominent ascites. Electronically Signed   By: Marcello Moores  Register   On: 10/04/2016 13:47   US Paracentesis  Result Date: 10/05/2016 INDICATION: 56 year old male with symptomatic large volume  ascites, cirrhosis and hepatocellular carcinoma. He presents for first paracentesis. EXAM: ULTRASOUND GUIDED  PARACENTESIS MEDICATIONS: None. COMPLICATIONS: None immediate. PROCEDURE: Informed written consent was obtained from the patient after a discussion of the risks, benefits and alternatives to treatment. A timeout was performed prior to the initiation of the procedure. Initial ultrasound scanning demonstrates a large amount of ascites within the right lower abdominal quadrant. The right lower abdomen was prepped and draped in the usual sterile fashion. 1% lidocaine with epinephrine was used for local anesthesia. Following this, a 6 Fr Safe-T-Centesis catheter was introduced. An ultrasound image was saved for documentation purposes. The paracentesis was performed. The catheter was removed and a dressing was applied. The patient tolerated the procedure well without immediate post procedural complication. FINDINGS: A total of approximately 5000 mL of amber colored fluid was removed. Samples were sent to the laboratory as requested by the clinical team. IMPRESSION: Successful ultrasound-guided paracentesis yielding 5 liters of peritoneal fluid. Electronically Signed   By: Jacqulynn Cadet M.D.   On: 10/05/2016 11:36   US Abdomen Limited Ruq  Result Date: 10/04/2016 CLINICAL DATA:  Hepatocellular carcinoma . EXAM: US ABDOMEN LIMITED - RIGHT UPPER QUADRANT COMPARISON:  Ultrasound 02/21/2014. FINDINGS: Gallbladder: Previously identified tiny gallbladder polyp not identified on today's exam. Negative Murphy sign. Gallbladder wall thickness 4.4 mm. This may be from hyperproteinemia as opposed cholecystitis. Common bile duct: Diameter: 2.4 mm Liver: Liver has a nodular heterogeneous parenchymal pattern consistent with cirrhosis. 3.6 x 2.5 x 3.8 cm and 2.8 x 1.5 x 2.3 cm masses noted in the right lobe liver. These findings could represent metastatic disease. Multifocal hepatocellular carcinoma could present this  fashion . Reversal portal flow appears to be present suggesting portal hypertension. Prominent ascites. IMPRESSION: 1. 3.8 cm and 2.8 cm separate masses are noted the right lobe liver. These findings could represent metastatic disease. Multifocal hepatocellular carcinoma cannot be excluded. 2. Liver again has a nodular heterogeneous parenchymal pattern consistent with cirrhosis . Reversal portal flow is appears to be present suggesting portal hypertension. 3. Previously identified tiny gallbladder polyp not identified on today's exam. Gallbladder wall thickness is 4.4 mm. This may be secondary to hyperproteinemia as opposed to cholecystitis. 4.  Prominent ascites. Electronically Signed   By: Marcello Moores  Register   On: 10/04/2016 13:47       Today   Subjective:   Aaron Jacobson  patient feeling better swelling last ready to go home  Objective:   Blood pressure 114/61, pulse 69, temperature 98.1 F (36.7 C), temperature source Oral, resp. rate 18, height 6\' 1"  (1.854 m), weight 211 lb 3 oz (95.8 kg), SpO2 98 %.  .  Intake/Output Summary (Last 24 hours) at 10/08/16 1402 Last data filed at 10/08/16 1345  Gross per 24 hour  Intake              723 ml  Output             3325 ml  Net            -2602 ml    Exam VITAL SIGNS: Blood pressure 114/61, pulse 69, temperature 98.1 F (36.7 C), temperature source Oral, resp. rate 18, height 6\' 1"  (1.854 m), weight 211 lb 3 oz (95.8 kg), SpO2 98 %.  GENERAL:  56 y.o.-year-old patient lying in the bed with no acute distress.  EYES: Pupils equal, round, reactive to light and accommodation. No scleral icterus. Extraocular muscles intact.  HEENT: Head atraumatic, normocephalic. Oropharynx and nasopharynx clear.  NECK:  Supple, no jugular venous distention. No thyroid enlargement, no tenderness.  LUNGS: Normal  breath sounds bilaterally, no wheezing, rales,rhonchi or crepitation. No use of accessory muscles of respiration.  CARDIOVASCULAR: S1, S2 normal. No  murmurs, rubs, or gallops.  ABDOMEN: Soft, nontender, mildly distended. Bowel sounds present. No organomegaly or mass.  EXTREMITIES: No pedal edema, cyanosis, or clubbing.  NEUROLOGIC: Cranial nerves II through XII are intact. Muscle strength 5/5 in all extremities. Sensation intact. Gait not checked.  PSYCHIATRIC: The patient is alert and oriented x 3.  SKIN: No obvious rash, lesion, or ulcer.   Data Review     CBC w Diff: Lab Results  Component Value Date   WBC 1.7 (L) 10/06/2016   HGB 8.9 (L) 10/06/2016   HGB 12.2 (L) 05/10/2014   HCT 26.0 (L) 10/06/2016   HCT 37.2 (L) 05/10/2014   PLT 34 (L) 10/06/2016   PLT 67 (L) 05/10/2014   LYMPHOPCT 23% 05/17/2015   MONOPCT 13% 05/17/2015   EOSPCT 7% 05/17/2015   BASOPCT 0% 05/17/2015   CMP: Lab Results  Component Value Date   NA 140 10/08/2016   NA 138 05/10/2014   K 3.8 10/08/2016   K 3.3 (L) 05/10/2014   CL 112 (H) 10/08/2016   CL 107 05/10/2014   CO2 23 10/08/2016   CO2 19 (L) 05/10/2014   BUN 7 10/08/2016   BUN 11 05/10/2014   CREATININE 0.54 (L) 10/08/2016   CREATININE 0.85 05/10/2014   PROT 5.2 (L) 10/06/2016   PROT 7.4 05/10/2014   ALBUMIN 2.5 (L) 10/06/2016   ALBUMIN 3.4 05/10/2014   BILITOT 2.5 (H) 10/06/2016   BILITOT 1.8 (H) 05/10/2014   ALKPHOS 94 10/06/2016   ALKPHOS 77 05/10/2014   AST 47 (H) 10/06/2016   AST 218 (H) 05/10/2014   ALT 16 (L) 10/06/2016   ALT 103 (H) 05/10/2014  .  Micro Results Recent Results (from the past 240 hour(s))  Body fluid culture     Status: None   Collection Time: 10/05/16 10:43 AM  Result Value Ref Range Status   Specimen Description PERITONEAL  Final   Special Requests NONE  Final   Gram Stain   Final    FEW WBC PRESENT,BOTH PMN AND MONONUCLEAR NO ORGANISMS SEEN    Culture   Final    NO GROWTH 3 DAYS Performed at [facility], [address] [place], [place] [address]    Report Status 10/08/2016 FINAL  Final        Code Status Orders        Start      Ordered   10/04/16 1639  Full code  Continuous     10/04/16 1638    Code Status History    Date Active Date Inactive Code Status Order ID Comments User Context   04/06/2016  1:44 AM 04/07/2016  7:12 PM Full Code [id number]  Harvie Bridge, DO Inpatient          Follow-up Information    primary md Follow up in 5 day(s).   Why:  primary md needs to evaluate again in few weeks to see if he needs paracentesis          Discharge Medications   Allergies as of 10/08/2016   No Known Allergies     Medication List    TAKE these medications   carvedilol 6.25 MG tablet Commonly known as:  COREG Take 6.25 mg by mouth 2 (two) times daily with a meal.   furosemide 40 MG tablet Commonly known as:  LASIX Take 1 tablet (40 mg total)  by mouth 2 (two) times daily.   ibuprofen 600 MG tablet Commonly known as:  ADVIL,MOTRIN Take 600 mg by mouth every 6 (six) hours as needed for moderate pain.   OLANZapine 10 MG tablet Commonly known as:  ZYPREXA Take 10 mg by mouth at bedtime.   omeprazole 40 MG capsule Commonly known as:  PRILOSEC Take 40 mg by mouth daily.   spironolactone 50 MG tablet Commonly known as:  ALDACTONE Take 1 tablet (50 mg total) by mouth daily. Start taking on:  10/09/2016          Total Time in preparing paper work, data evaluation and todays exam - 35 minutes  Dustin Flock M.D on 10/08/2016 at 2:02 [facility]  [facility]   Office  [phone]

## 2016-10-08 NOTE — Progress Notes (Signed)
Discharge instructions given and went over with patient at bedside. Prescriptions given and reviewed. All questions answered. Patient discharged home via wheelchair by nursing staff. Madlyn Frankel, RN

## 2016-11-25 ENCOUNTER — Emergency Department: Payer: Medicaid Other

## 2016-11-25 ENCOUNTER — Encounter: Payer: Self-pay | Admitting: Emergency Medicine

## 2016-11-25 ENCOUNTER — Inpatient Hospital Stay
Admission: EM | Admit: 2016-11-25 | Discharge: 2016-11-26 | DRG: 433 | Disposition: A | Discharge status: Home / Self Care | Payer: Medicaid Other | Attending: Internal Medicine | Admitting: Internal Medicine

## 2016-11-25 DIAGNOSIS — C220 Liver cell carcinoma: Secondary | ICD-10-CM | POA: Diagnosis present

## 2016-11-25 DIAGNOSIS — Z79899 Other long term (current) drug therapy: Secondary | ICD-10-CM | POA: Diagnosis not present

## 2016-11-25 DIAGNOSIS — R601 Generalized edema: Secondary | ICD-10-CM | POA: Diagnosis present

## 2016-11-25 DIAGNOSIS — E876 Hypokalemia: Secondary | ICD-10-CM | POA: Diagnosis present

## 2016-11-25 DIAGNOSIS — F319 Bipolar disorder, unspecified: Secondary | ICD-10-CM | POA: Diagnosis present

## 2016-11-25 DIAGNOSIS — Z915 Personal history of self-harm: Secondary | ICD-10-CM | POA: Diagnosis not present

## 2016-11-25 DIAGNOSIS — Z66 Do not resuscitate: Secondary | ICD-10-CM | POA: Diagnosis present

## 2016-11-25 DIAGNOSIS — K7031 Alcoholic cirrhosis of liver with ascites: Principal | ICD-10-CM | POA: Diagnosis present

## 2016-11-25 DIAGNOSIS — F200 Paranoid schizophrenia: Secondary | ICD-10-CM | POA: Diagnosis present

## 2016-11-25 DIAGNOSIS — R0602 Shortness of breath: Secondary | ICD-10-CM | POA: Diagnosis present

## 2016-11-25 DIAGNOSIS — Z9114 Patient's other noncompliance with medication regimen: Secondary | ICD-10-CM | POA: Diagnosis not present

## 2016-11-25 DIAGNOSIS — B182 Chronic viral hepatitis C: Secondary | ICD-10-CM | POA: Diagnosis present

## 2016-11-25 DIAGNOSIS — F1010 Alcohol abuse, uncomplicated: Secondary | ICD-10-CM | POA: Diagnosis present

## 2016-11-25 DIAGNOSIS — F17200 Nicotine dependence, unspecified, uncomplicated: Secondary | ICD-10-CM | POA: Diagnosis present

## 2016-11-25 DIAGNOSIS — Z8249 Family history of ischemic heart disease and other diseases of the circulatory system: Secondary | ICD-10-CM | POA: Diagnosis not present

## 2016-11-25 DIAGNOSIS — Z923 Personal history of irradiation: Secondary | ICD-10-CM | POA: Diagnosis not present

## 2016-11-25 DIAGNOSIS — R188 Other ascites: Secondary | ICD-10-CM | POA: Diagnosis present

## 2016-11-25 DIAGNOSIS — E871 Hypo-osmolality and hyponatremia: Secondary | ICD-10-CM | POA: Diagnosis present

## 2016-11-25 DIAGNOSIS — D6959 Other secondary thrombocytopenia: Secondary | ICD-10-CM | POA: Diagnosis present

## 2016-11-25 DIAGNOSIS — Z716 Tobacco abuse counseling: Secondary | ICD-10-CM

## 2016-11-25 LAB — HEPATIC FUNCTION PANEL
ALT: 36 U/L (ref 17–63)
AST: 88 U/L — ABNORMAL HIGH (ref 15–41)
Albumin: 2.2 g/dL — ABNORMAL LOW (ref 3.5–5.0)
Alkaline Phosphatase: 144 U/L — ABNORMAL HIGH (ref 38–126)
Bilirubin, Direct: 4.2 mg/dL — ABNORMAL HIGH (ref 0.1–0.5)
Indirect Bilirubin: 5.1 mg/dL — ABNORMAL HIGH (ref 0.3–0.9)
Total Bilirubin: 9.3 mg/dL — ABNORMAL HIGH (ref 0.3–1.2)
Total Protein: 6.5 g/dL (ref 6.5–8.1)

## 2016-11-25 LAB — CBC WITH DIFFERENTIAL/PLATELET
BASOS PCT: 0 %
Basophils Absolute: 0 10*3/uL (ref 0–0.1)
EOS ABS: 0.3 10*3/uL (ref 0–0.7)
Eosinophils Relative: 6 %
HEMATOCRIT: 31.8 % — AB (ref 40.0–52.0)
HEMOGLOBIN: 10.8 g/dL — AB (ref 13.0–18.0)
LYMPHS ABS: 0.3 10*3/uL — AB (ref 1.0–3.6)
Lymphocytes Relative: 7 %
MCH: 36.7 pg — AB (ref 26.0–34.0)
MCHC: 34.1 g/dL (ref 32.0–36.0)
MCV: 107.7 fL — ABNORMAL HIGH (ref 80.0–100.0)
MONO ABS: 0.7 10*3/uL (ref 0.2–1.0)
MONOS PCT: 17 %
NEUTROS ABS: 3.1 10*3/uL (ref 1.4–6.5)
Neutrophils Relative %: 70 %
PLATELETS: 53 10*3/uL — AB (ref 150–440)
RBC: 2.95 MIL/uL — ABNORMAL LOW (ref 4.40–5.90)
RDW: 21.1 % — ABNORMAL HIGH (ref 11.5–14.5)
WBC: 4.4 10*3/uL (ref 3.8–10.6)

## 2016-11-25 LAB — BASIC METABOLIC PANEL
ANION GAP: 6 (ref 5–15)
ANION GAP: 5 (ref 5–15)
BUN: 8 mg/dL (ref 6–20)
BUN: 8 mg/dL (ref 6–20)
CHLORIDE: 105 mmol/L (ref 101–111)
CO2: 23 mmol/L (ref 22–32)
CO2: 23 mmol/L (ref 22–32)
Calcium: 7.8 mg/dL — ABNORMAL LOW (ref 8.9–10.3)
Calcium: 7.6 mg/dL — ABNORMAL LOW (ref 8.9–10.3)
Chloride: 104 mmol/L (ref 101–111)
Creatinine, Ser: 0.59 mg/dL — ABNORMAL LOW (ref 0.61–1.24)
Creatinine, Ser: 0.6 mg/dL — ABNORMAL LOW (ref 0.61–1.24)
GFR calc Af Amer: >60 mL/min (ref >60)
GFR calc non Af Amer: >60 mL/min (ref >60)
GFR calc non Af Amer: >60 mL/min (ref >60)
GLUCOSE: 165 mg/dL — AB (ref 65–99)
GLUCOSE: 120 mg/dL — AB (ref 65–99)
POTASSIUM: 3 mmol/L — AB (ref 3.5–5.1)
POTASSIUM: 3.1 mmol/L — AB (ref 3.5–5.1)
Sodium: 133 mmol/L — ABNORMAL LOW (ref 135–145)
Sodium: 133 mmol/L — ABNORMAL LOW (ref 135–145)

## 2016-11-25 LAB — URINALYSIS, COMPLETE (UACMP) WITH MICROSCOPIC
Bacteria, UA: NONE SEEN
Bilirubin Urine: NEGATIVE
Glucose, UA: NEGATIVE mg/dL
Hgb urine dipstick: NEGATIVE
Ketones, ur: NEGATIVE mg/dL
Leukocytes, UA: NEGATIVE
Nitrite: NEGATIVE
Protein, ur: NEGATIVE mg/dL
RBC / HPF: NONE SEEN RBC/hpf (ref 0–5)
Specific Gravity, Urine: 1.01 (ref 1.005–1.030)
Squamous Epithelial / HPF: NONE SEEN
pH: 6 (ref 5.0–8.0)

## 2016-11-25 LAB — AMMONIA: Ammonia: 132 umol/L — ABNORMAL HIGH (ref 9–35)

## 2016-11-25 LAB — PROTIME-INR
INR: 1.58
Prothrombin Time: 19 s — ABNORMAL HIGH (ref 11.4–15.2)

## 2016-11-25 MED ORDER — ONDANSETRON HCL 4 MG PO TABS
4.0000 mg | ORAL_TABLET | Freq: Four times a day (QID) | ORAL | Status: DC | PRN
Start: 2016-11-25 — End: 2016-11-26

## 2016-11-25 MED ORDER — POTASSIUM CHLORIDE CRYS ER 20 MEQ PO TBCR
20.0000 meq | EXTENDED_RELEASE_TABLET | ORAL | Status: AC
  Administered 2016-11-25 (×2): 20 meq via ORAL
  Filled 2016-11-25: qty 1

## 2016-11-25 MED ORDER — SODIUM CHLORIDE 0.9% FLUSH
3.0000 mL | Freq: Two times a day (BID) | INTRAVENOUS | Status: DC
  Administered 2016-11-25 – 2016-11-26 (×2): 3 mL via INTRAVENOUS

## 2016-11-25 MED ORDER — POTASSIUM CHLORIDE 2 MEQ/ML IV SOLN
30.0000 meq | INTRAVENOUS | Status: AC
  Administered 2016-11-25 – 2016-11-26 (×2): 30 meq via INTRAVENOUS
  Filled 2016-11-25 (×2): qty 15

## 2016-11-25 MED ORDER — ACETAMINOPHEN 650 MG RE SUPP: 650.0000 mg | Freq: Four times a day (QID) | RECTAL | Status: DC | PRN

## 2016-11-25 MED ORDER — CARVEDILOL 6.25 MG PO TABS
6.2500 mg | ORAL_TABLET | Freq: Two times a day (BID) | ORAL | Status: DC
Start: 2016-11-25 — End: 2016-11-26
  Administered 2016-11-26: 6.25 mg via ORAL
  Filled 2016-11-25: qty 1

## 2016-11-25 MED ORDER — BISACODYL 5 MG PO TBEC: 5.0000 mg | DELAYED_RELEASE_TABLET | Freq: Every day | ORAL | Status: DC | PRN

## 2016-11-25 MED ORDER — OXYCODONE HCL 5 MG PO TABS
5.0000 mg | ORAL_TABLET | Freq: Four times a day (QID) | ORAL | Status: DC | PRN
  Administered 2016-11-25: 5 mg via ORAL
  Filled 2016-11-25: qty 1

## 2016-11-25 MED ORDER — SENNOSIDES-DOCUSATE SODIUM 8.6-50 MG PO TABS: 1.0000 | ORAL_TABLET | Freq: Every evening | ORAL | Status: DC | PRN

## 2016-11-25 MED ORDER — ONDANSETRON HCL 4 MG/2ML IJ SOLN: 4.0000 mg | Freq: Four times a day (QID) | INTRAMUSCULAR | Status: DC | PRN

## 2016-11-25 MED ORDER — SPIRONOLACTONE 25 MG PO TABS
50.0000 mg | ORAL_TABLET | Freq: Every day | ORAL | Status: DC
  Administered 2016-11-26: 50 mg via ORAL
  Filled 2016-11-25: qty 2

## 2016-11-25 MED ORDER — PANTOPRAZOLE SODIUM 40 MG PO TBEC
40.0000 mg | DELAYED_RELEASE_TABLET | Freq: Every day | ORAL | Status: DC
  Administered 2016-11-26: 40 mg via ORAL
  Filled 2016-11-25: qty 1

## 2016-11-25 MED ORDER — FUROSEMIDE 10 MG/ML IJ SOLN
40.0000 mg | Freq: Two times a day (BID) | INTRAMUSCULAR | Status: DC
  Administered 2016-11-25 – 2016-11-26 (×2): 40 mg via INTRAVENOUS
  Filled 2016-11-25 (×2): qty 4

## 2016-11-25 MED ORDER — IBUPROFEN 400 MG PO TABS: 400.0000 mg | ORAL_TABLET | Freq: Four times a day (QID) | ORAL | Status: DC | PRN

## 2016-11-25 MED ORDER — ACETAMINOPHEN 325 MG PO TABS: 650.0000 mg | ORAL_TABLET | Freq: Four times a day (QID) | ORAL | Status: DC | PRN

## 2016-11-25 MED ORDER — OLANZAPINE 10 MG PO TABS
10.0000 mg | ORAL_TABLET | Freq: Every day | ORAL | Status: DC
  Administered 2016-11-25: 10 mg via ORAL
  Filled 2016-11-25 (×2): qty 1

## 2016-11-25 NOTE — ED Triage Notes (Signed)
Pt via ems from home with edema to legs, abdomen, scrotum. Pt was put on lasix on Monday and was told to come to ED if sob occurred, which is now his chief complaint. Pt has lung cancer and cirrhosis. Pt alert & oriented with NAD noted. 93% on RA.

## 2016-11-25 NOTE — Progress Notes (Signed)
PT Cancellation Note  Patient Details Name: Aaron Jacobson MRN: [id number] DOB: 09/13/1960   Cancelled Treatment:    Reason Eval/Treat Not Completed: Patient declined, no reason specified.  Pt reporting being SOB with minimal activity and declining PT today (pt reports plan for paracentesis soon today).  Will re-attempt PT eval at a later date/time.  Leitha Bleak, PT 11/25/16, 3:50 PM [phone]

## 2016-11-25 NOTE — Progress Notes (Signed)
MEDICATION RELATED CONSULT NOTE - INITIAL   Pharmacy Consult for electrolyte replacement and monitoring Indication: hypokalemia  No Known Allergies  Patient Measurements: Height: 6\' 1"  (185.4 cm) Weight: 200 lb (90.7 kg) IBW/kg (Calculated) : 79.9 Adjusted Body Weight:   Vital Signs: Temp: 98.2 F (36.8 C) (04/05 2006) Temp Source: Oral (04/05 2006) BP: 136/81 (04/05 2006) Pulse Rate: 116 (04/05 2006) Intake/Output from previous day: No intake/output data recorded. Intake/Output from this shift: No intake/output data recorded.  Labs:  Recent Labs  11/25/16 0955 11/25/16 2051  WBC 4.4  --   HGB 10.8*  --   HCT 31.8*  --   PLT 53*  --   CREATININE 0.59* 0.60*  ALBUMIN 2.2*  --   PROT 6.5  --   AST 88*  --   ALT 36  --   ALKPHOS 144*  --   BILITOT 9.3*  --   BILIDIR 4.2*  --   IBILI 5.1*  --    Estimated Creatinine Clearance: 119.3 mL/min (A) (by C-G formula based on SCr of 0.6 mg/dL (L)).   Microbiology: No results found for this or any previous visit (from the past 720 hour(s)).  Medical History: Past Medical History:  Diagnosis Date  . Bipolar 1 disorder ([place])   . Cancer ([place])   . Cirrhosis ([place])   . Hep C w/ coma, chronic ([place])   . Liver cancer ([place])   . Paranoid schizophrenia ([place])   . Suicide attempt     Medications:  Infusions:    Assessment: [name]. Patient reports noncompliance with outpatient medications including furosemide and spironolactone. Presents with ascites and anasarca due to cirrhosis and hepatocellular carcinoma, plan is to diurese. Pharmacy consulted to dose electrolytes.    Goal of Therapy:  Maintain electrolytes WNL  Plan:  1. Potassium chloride 20 mEq po Q2H x 2 doses 2. Recheck BMP this evening and redose as needed to maintain K 3.5 to 5 mEq/L 3. Recheck all electrolytes tomorrow with AM labs  4/5: K @ 21:00 = 3.1 Will order KCl 30 mEq IV X 2 and recheck K on 4/6 with AM labs.   Aaron Jacobson D,  Pharm.D Clinical Pharmacist 11/25/2016,10:30 PM

## 2016-11-25 NOTE — H&P (Signed)
[facility] [place] NAME: Aaron Jacobson    MR#:  [id number]  DATE OF BIRTH:  03-19-1961  DATE OF ADMISSION:  11/25/2016  PRIMARY CARE PHYSICIAN: dr Elyse Jarvis   REQUESTING/REFERRING PHYSICIAN: Dr Yevette Edwards  CHIEF COMPLAINT:   LEE and Shortness of breath HISTORY OF PRESENT ILLNESS:  Aaron Jacobson  is a 56 y.o. male with a known history of  Liver cirrhosis due to EtOH abuse and hepatocellular carcinoma who presents with above complaint. Patient reports over the past week he has had increasing dyspnea exertion, shortness of breath and lower extremity edema. He has not been compliant with outpatient medications including Lasix and Aldactone. When patient arrived via EMS his oxygen saturation was in the high 70s on room air. He is currently 94% on 2 L. Plan is for paracentesis which was requested by ER physician.   PAST MEDICAL HISTORY:   Past Medical History:  Diagnosis Date  . Bipolar 1 disorder ([place])   . Cancer ([place])   . Cirrhosis ([place])   . Hep C w/ coma, chronic ([place])   . Liver cancer ([place])   . Paranoid schizophrenia ([place])   . Suicide attempt     PAST SURGICAL HISTORY:   Past Surgical History:  Procedure Laterality Date  . ABDOMINAL SURGERY      SOCIAL HISTORY:   Social History  Substance Use Topics  . Smoking status: Former Smoker    Packs/day: 0.10    Types: Cigarettes    Quit date: 11/18/2016  . Smokeless tobacco: Never Used  . Alcohol use No    FAMILY HISTORY:   Family History  Problem Relation Age of Onset  . Hypertension Mother     DRUG ALLERGIES:  No Known Allergies  REVIEW OF SYSTEMS:   Review of Systems  Constitutional: Negative.  Negative for chills, fever and malaise/fatigue.  HENT: Negative.  Negative for ear discharge, ear pain, hearing loss, nosebleeds and sore throat.   Eyes: Negative.  Negative for blurred vision and pain.  Respiratory: Positive for shortness of breath. Negative for cough,  hemoptysis and wheezing.   Cardiovascular: Positive for orthopnea and PND. Negative for chest pain, palpitations, claudication and leg swelling.  Gastrointestinal: Positive for abdominal pain. Negative for blood in stool, diarrhea, nausea and vomiting.  Genitourinary: Negative.  Negative for dysuria.  Musculoskeletal: Negative.  Negative for back pain.  Skin: Negative.   Neurological: Negative for dizziness, tremors, speech change, focal weakness, seizures and headaches.  Endo/Heme/Allergies: Negative.  Does not bruise/bleed easily.  Psychiatric/Behavioral: Negative.  Negative for depression, hallucinations and suicidal ideas.    MEDICATIONS AT HOME:   Prior to Admission medications   Medication Sig Start Date End Date Taking? Authorizing Provider  carvedilol (COREG) 6.25 MG tablet Take 6.25 mg by mouth 2 (two) times daily with a meal.   Yes Historical Provider, MD  furosemide (LASIX) 40 MG tablet Take 1 tablet (40 mg total) by mouth 2 (two) times daily. 10/08/16  Yes Dustin Flock, MD  ibuprofen (ADVIL,MOTRIN) 600 MG tablet Take 600 mg by mouth every 6 (six) hours as needed for moderate pain.   Yes Historical Provider, MD  OLANZapine (ZYPREXA) 10 MG tablet Take 10 mg by mouth at bedtime.   Yes Historical Provider, MD  omeprazole (PRILOSEC) 40 MG capsule Take 40 mg by mouth daily.    Yes Historical Provider, MD  spironolactone (ALDACTONE) 50 MG tablet Take 1 tablet (50 mg total) by mouth daily. 10/09/16  Yes  Dustin Flock, MD      VITAL SIGNS:  Blood pressure (!) 140/92, pulse (!) 109, temperature 97.5 F (36.4 C), temperature source Oral, resp. rate (!) 22, height 6\' 1"  (1.854 m), weight 90.7 kg (200 lb), SpO2 93 %.  PHYSICAL EXAMINATION:   Physical Exam  Constitutional: He is oriented to person, place, and time and well-developed, well-nourished, and in no distress. No distress.  HENT:  Head: Normocephalic.  Eyes: No scleral icterus.  Neck: Normal range of motion. Neck supple. No  JVD present. No tracheal deviation present.  Cardiovascular: Normal rate, regular rhythm and normal heart sounds.  Exam reveals no gallop and no friction rub.   No murmur heard. Pulmonary/Chest: Effort normal and breath sounds normal. No respiratory distress. He has no wheezes. He has no rales. He exhibits no tenderness.  Abdominal: Soft. He exhibits distension. There is no tenderness. There is no rebound and no guarding.  Due to ascites cannot appreciate bowel sounds  Genitourinary:  Genitourinary Comments: Scrotal swelling significant  Musculoskeletal: Normal range of motion. He exhibits edema.  Anasarca  Neurological: He is alert and oriented to person, place, and time.  Skin: Skin is warm. No rash noted. No erythema.  Psychiatric: Affect and judgment normal.      LABORATORY PANEL:   CBC  Recent Labs Lab 11/25/16 0955  WBC 4.4  HGB 10.8*  HCT 31.8*  PLT 53*   ------------------------------------------------------------------------------------------------------------------  Chemistries   Recent Labs Lab 11/25/16 0955  NA 133*  K 3.0*  CL 104  CO2 23  GLUCOSE 165*  BUN 8  CREATININE 0.59*  CALCIUM 7.8*  AST 88*  ALT 36  ALKPHOS 144*  BILITOT 9.3*   ------------------------------------------------------------------------------------------------------------------  Cardiac Enzymes No results for input(s): TROPONINI in the last 168 hours. ------------------------------------------------------------------------------------------------------------------  RADIOLOGY:  Dg Chest Port 1 View  Result Date: 11/25/2016 CLINICAL DATA:  Shortness of breath, history of lung carcinoma as well as changes of cirrhosis, lower extremity edema EXAM: PORTABLE CHEST 1 VIEW COMPARISON:  Chest x-ray of 10/04/2016 FINDINGS: The lungs are not well aerated. No pneumonia or effusion is seen. There is no evidence of congestive heart failure. Mediastinal and hilar contours are unremarkable.  The heart is within normal limits in size. No bony abnormality is seen. IMPRESSION: Poor inspiration chest x-ray.  No active lung disease. Electronically Signed   By: Ivar Drape M.D.   On: 11/25/2016 10:09    EKG:   pending  IMPRESSION AND PLAN:   56 year old male with hepatocellular carcinoma receiving radiation and liver cirrhosis due to EtOH abuse who presents with ascites and anasarca.  1. Ascites with anasarca due to underlying liver cirrhosis and hepatocellular carcinoma with noncompliance of medications: Plan for paracentesis as requested by ED physician IV Lasix twice a day Continue Aldactone Monitor BMP Monitor daily weight with intake and output  2. Hypokalemia: Replete and check magnesium BMP for a.m.  3. Hyponatremia from liver cirrhosis Follow closely on IV Lasix  4. Elevated LFTs, chronic with Thrombocytopenia from underlying liver cirrhosis  5.  Tobacco dependence: Patient is encouraged to quit smoking. Counseling was provided for 4 minutes.  6. Bipolar affective disorder/paranoid schizophrenia: Continue olanzapine Check EKG    All the records are reviewed and case discussed with ED provider. Management plans discussed with the patient and he is in agreement  CODE STATUS: DNR  TOTAL TIME TAKING CARE OF THIS PATIENT: 45 minutes.    Akram Kissick M.D on 11/25/2016 at 11:28 AM  Between 7am to  6pm - Pager - [phone]  After 6pm go to [email or web address] - password [email or web address] [facility]  Office  [phone]  CC: Primary care physician; [name]

## 2016-11-25 NOTE — ED Provider Notes (Signed)
[facility] Emergency Department Provider Note  ____________________________________________   First MD Initiated Contact with Patient 11/25/16 [phone]     (approximate)  I have reviewed the triage vital signs and the nursing notes.   HISTORY  Chief Complaint Shortness of Breath and Leg Swelling    HPI Aaron Jacobson is a 56 y.o. male who comes to the emergency department via EMS for bilateral lower extremity swelling and shortness of breath. He has a past medical history of cirrhosis secondary to alcohol abuse and hepatocellular carcinoma. He has not been compliant with his Lasix and spironolactone and only recently restarted his medications yesterday. Today he called his primary care physician and said that he was having difficulty breathing and his physician advised him to call 911. The patient has had 1 lifetime paracentesis. He denies fevers or chills. He denies cough. He says he cannot lie flat secondary to shortness of breath. When EMS arrived his oxygen saturation was in the high 70s but perked up with oxygen.   Past Medical History:  Diagnosis Date  . Bipolar 1 disorder ([place])   . Cancer ([place])   . Cirrhosis ([place])   . Hep C w/ coma, chronic ([place])   . Liver cancer ([place])   . Paranoid schizophrenia ([place])   . Suicide attempt     Patient Active Problem List   Diagnosis Date Noted  . Anasarca 10/04/2016  . Hypotension 04/06/2016  . Suicide attempt by beta blocker overdose ([place]) 04/06/2016  . Depression 04/06/2016  . Liver cancer ([place]) 04/06/2016  . Bipolar 1 disorder ([place]) 04/06/2016  . Hepatitis C 04/06/2016  . Paranoid schizophrenia ([place]) 04/06/2016  . Bradycardia   . Prolonged Q-T interval on ECG     Past Surgical History:  Procedure Laterality Date  . ABDOMINAL SURGERY      Prior to Admission medications   Medication Sig Start Date End Date Taking? Authorizing Provider  carvedilol (COREG) 6.25 MG tablet Take 6.25 mg by mouth 2 (two) times  daily with a meal.    Historical Provider, MD  furosemide (LASIX) 40 MG tablet Take 1 tablet (40 mg total) by mouth 2 (two) times daily. 10/08/16   Dustin Flock, MD  ibuprofen (ADVIL,MOTRIN) 600 MG tablet Take 600 mg by mouth every 6 (six) hours as needed for moderate pain.    Historical Provider, MD  OLANZapine (ZYPREXA) 10 MG tablet Take 10 mg by mouth at bedtime.    Historical Provider, MD  omeprazole (PRILOSEC) 40 MG capsule Take 40 mg by mouth daily.     Historical Provider, MD  spironolactone (ALDACTONE) 50 MG tablet Take 1 tablet (50 mg total) by mouth daily. 10/09/16   Dustin Flock, MD    Allergies Patient has no known allergies.  Family History  Problem Relation Age of Onset  . Hypertension Mother     Social History Social History  Substance Use Topics  . Smoking status: Former Smoker    Packs/day: 0.10    Types: Cigarettes    Quit date: 11/18/2016  . Smokeless tobacco: Never Used  . Alcohol use No    Review of Systems Constitutional: No fever/chills Eyes: No visual changes. ENT: No sore throat. Cardiovascular: Positive chest pain. Respiratory: Positive shortness of breath. Gastrointestinal: Positive abdominal pain.  No nausea, no vomiting.  No diarrhea.  No constipation. Genitourinary: Negative for dysuria. Musculoskeletal: Negative for back pain. Skin: Negative for rash. Neurological: Negative for headaches, focal weakness or numbness.  10-point ROS otherwise negative.  ____________________________________________  PHYSICAL EXAM:  VITAL SIGNS: ED Triage Vitals  Enc Vitals Group     BP 11/25/16 0945 (!) 140/92     Pulse Rate 11/25/16 0945 (!) 109     Resp 11/25/16 0945 (!) 22     Temp 11/25/16 0945 97.5 F (36.4 C)     Temp Source 11/25/16 0945 Oral     SpO2 11/25/16 0945 93 %     Weight 11/25/16 0947 200 lb (90.7 kg)     Height 11/25/16 0947 6\' 1"  (1.854 m)     Head Circumference --      Peak Flow --      Pain Score --      Pain Loc --       Pain Edu? --      Excl. in [place]? --     Constitutional: Alert and oriented x 4 Jaundiced and icteric and chronically ill-appearing elevated respiratory rate Eyes: PERRL EOMI. icteric Head: Atraumatic. Nose: No congestion/rhinnorhea. Mouth/Throat: No trismus Neck: No stridor.   Cardiovascular: Tachycardic rate, regular rhythm. Grossly normal heart sounds.  Good peripheral circulation. Respiratory: Shallow rapid breaths with crackles at bilateral bases Gastrointestinal: Distended and tense abdomen diffuse mild tenderness to percussion tenderness negative Murphy's Musculoskeletal: 3+ bilateral lower extremity edema up to his groin Neurologic:  Normal speech and language. No gross focal neurologic deficits are appreciated. Skin:  Significant jaundice Psychiatric: Mood and affect are normal. Speech and behavior are normal.    ____________________________________________   DIFFERENTIAL  Anasarca, medication noncompliance, congestive heart failure, ascites, pneumonia, spontaneous bacterial peritonitis ____________________________________________   LABS (all labs ordered are listed, but only abnormal results are displayed)  Labs Reviewed  BASIC METABOLIC PANEL - Abnormal; Notable for the following:       Result Value   Sodium 133 (*)    Potassium 3.0 (*)    Glucose, Bld 165 (*)    Creatinine, Ser 0.59 (*)    Calcium 7.8 (*)    All other components within normal limits  HEPATIC FUNCTION PANEL - Abnormal; Notable for the following:    Albumin 2.2 (*)    AST 88 (*)    Alkaline Phosphatase 144 (*)    Total Bilirubin 9.3 (*)    Bilirubin, Direct 4.2 (*)    Indirect Bilirubin 5.1 (*)    All other components within normal limits  CBC WITH DIFFERENTIAL/PLATELET - Abnormal; Notable for the following:    RBC 2.95 (*)    Hemoglobin 10.8 (*)    HCT 31.8 (*)    MCV 107.7 (*)    MCH 36.7 (*)    RDW 21.1 (*)    Platelets 53 (*)    Lymphs Abs 0.3 (*)    All other components within  normal limits  PROTIME-INR - Abnormal; Notable for the following:    Prothrombin Time 19.0 (*)    All other components within normal limits  AMMONIA - Abnormal; Notable for the following:    Ammonia 132 (*)    All other components within normal limits  URINALYSIS, COMPLETE (UACMP) WITH MICROSCOPIC    Anemia with high MCV is consistent with alcoholic cirrhosis. Low platelets high INR low albumin are all consistent as well __________________________________________  EKG   ____________________________________________  RADIOLOGY  Chest x-ray with very low lung volumes but no acute pulmonary disease noted ____________________________________________   PROCEDURES  Procedure(s) performed: no  Procedures  Critical Care performed: no  ____________________________________________   INITIAL IMPRESSION / ASSESSMENT AND PLAN / ED COURSE  Pertinent  labs & imaging results that were available during my care of the patient were reviewed by me and considered in my medical decision making (see chart for details).  The patient arrives with a tense abdomen and a significant amount of ascites on bedside ultrasound. He is short of breath with comes up with nasal cannula. I believe his shortness of breath will significantly improve once he has a therapeutic paracentesis.   The patient has significant anasarca and his albumin is worse than before. He is acutely decompensated and in addition to his therapeutic paracentesis will require significant intravenous diuresis. At this point he requires inpatient admission.  ____________________________________________   FINAL CLINICAL IMPRESSION(S) / ED DIAGNOSES  Final diagnoses:  Shortness of breath  Anasarca      NEW MEDICATIONS STARTED DURING THIS VISIT:  New Prescriptions   No medications on file     Note:  This document was prepared using Dragon voice recognition software and may include unintentional dictation errors.     Darel Hong, MD 11/25/16 1049

## 2016-11-25 NOTE — Progress Notes (Signed)
MEDICATION RELATED CONSULT NOTE - INITIAL   Pharmacy Consult for electrolyte replacement and monitoring Indication: hypokalemia  No Known Allergies  Patient Measurements: Height: 6\' 1"  (185.4 cm) Weight: 200 lb (90.7 kg) IBW/kg (Calculated) : 79.9 Adjusted Body Weight:   Vital Signs: Temp: 97.5 F (36.4 C) (04/05 0945) Temp Source: Oral (04/05 0945) BP: 140/92 (04/05 0945) Pulse Rate: 109 (04/05 0945) Intake/Output from previous day: No intake/output data recorded. Intake/Output from this shift: No intake/output data recorded.  Labs:  Recent Labs  11/25/16 0955  WBC 4.4  HGB 10.8*  HCT 31.8*  PLT 53*  CREATININE 0.59*  ALBUMIN 2.2*  PROT 6.5  AST 88*  ALT 36  ALKPHOS 144*  BILITOT 9.3*  BILIDIR 4.2*  IBILI 5.1*   Estimated Creatinine Clearance: 119.3 mL/min (A) (by C-G formula based on SCr of 0.59 mg/dL (L)).   Microbiology: No results found for this or any previous visit (from the past 720 hour(s)).  Medical History: Past Medical History:  Diagnosis Date  . Bipolar 1 disorder ([place])   . Cancer ([place])   . Cirrhosis ([place])   . Hep C w/ coma, chronic ([place])   . Liver cancer ([place])   . Paranoid schizophrenia ([place])   . Suicide attempt     Medications:  Infusions:    Assessment: [name]. Patient reports noncompliance with outpatient medications including furosemide and spironolactone. Presents with ascites and anasarca due to cirrhosis and hepatocellular carcinoma, plan is to diurese. Pharmacy consulted to dose electrolytes.    Goal of Therapy:  Maintain electrolytes WNL  Plan:  1. Potassium chloride 20 mEq po Q2H x 2 doses 2. Recheck BMP this evening and redose as needed to maintain K 3.5 to 5 mEq/L 3. Recheck all electrolytes tomorrow with AM labs  Laural Benes, Pharm.D., BCPS Clinical Pharmacist 11/25/2016,12:08 PM

## 2016-11-26 ENCOUNTER — Inpatient Hospital Stay: Payer: Medicaid Other

## 2016-11-26 LAB — COMPREHENSIVE METABOLIC PANEL
ALT: 33 U/L (ref 17–63)
AST: 73 U/L — AB (ref 15–41)
Albumin: 2 g/dL — ABNORMAL LOW (ref 3.5–5.0)
Alkaline Phosphatase: 124 U/L (ref 38–126)
Anion gap: 5 (ref 5–15)
BUN: 9 mg/dL (ref 6–20)
CO2: 23 mmol/L (ref 22–32)
Calcium: 7.5 mg/dL — ABNORMAL LOW (ref 8.9–10.3)
Chloride: 106 mmol/L (ref 101–111)
Creatinine, Ser: 0.55 mg/dL — ABNORMAL LOW (ref 0.61–1.24)
Glucose, Bld: 120 mg/dL — ABNORMAL HIGH (ref 65–99)
POTASSIUM: 3.7 mmol/L (ref 3.5–5.1)
Sodium: 134 mmol/L — ABNORMAL LOW (ref 135–145)
Total Bilirubin: 8.3 mg/dL — ABNORMAL HIGH (ref 0.3–1.2)
Total Protein: 5.7 g/dL — ABNORMAL LOW (ref 6.5–8.1)

## 2016-11-26 LAB — CBC
HEMATOCRIT: 27.5 % — AB (ref 40.0–52.0)
Hemoglobin: 9.6 g/dL — ABNORMAL LOW (ref 13.0–18.0)
MCH: 38.4 pg — ABNORMAL HIGH (ref 26.0–34.0)
MCHC: 35.1 g/dL (ref 32.0–36.0)
MCV: 109.3 fL — ABNORMAL HIGH (ref 80.0–100.0)
Platelets: 45 10*3/uL — ABNORMAL LOW (ref 150–440)
RBC: 2.51 MIL/uL — AB (ref 4.40–5.90)
RDW: 20.9 % — ABNORMAL HIGH (ref 11.5–14.5)
WBC: 3.5 10*3/uL — AB (ref 3.8–10.6)

## 2016-11-26 LAB — PHOSPHORUS: PHOSPHORUS: 2.7 mg/dL (ref 2.5–4.6)

## 2016-11-26 LAB — MAGNESIUM: Magnesium: 1.6 mg/dL — ABNORMAL LOW (ref 1.7–2.4)

## 2016-11-26 LAB — HIV ANTIBODY (ROUTINE TESTING W REFLEX): HIV SCREEN 4TH GENERATION: NONREACTIVE

## 2016-11-26 MED ORDER — SPIRONOLACTONE 50 MG PO TABS: 50.0000 mg | ORAL_TABLET | Freq: Every day | ORAL | 2 refills | Status: DC

## 2016-11-26 MED ORDER — IBUPROFEN 400 MG PO TABS: 400.0000 mg | ORAL_TABLET | Freq: Three times a day (TID) | ORAL | Status: DC | PRN

## 2016-11-26 MED ORDER — FUROSEMIDE 40 MG PO TABS: 40.0000 mg | ORAL_TABLET | Freq: Two times a day (BID) | ORAL | 2 refills | Status: AC

## 2016-11-26 MED ORDER — MAGNESIUM SULFATE 4 GM/100ML IV SOLN
4.0000 g | Freq: Once | INTRAVENOUS | Status: AC
  Administered 2016-11-26: 4 g via INTRAVENOUS
  Filled 2016-11-26: qty 100

## 2016-11-26 NOTE — Progress Notes (Signed)
IV and tele were removed. Discharge instructions, follow-up appointments, and prescriptions were provided to the pt. The pt refused a wheelchair and wanted to walk downstairs.

## 2016-11-26 NOTE — Progress Notes (Signed)
PT Cancellation Note  Patient Details Name: Aaron Jacobson MRN: [id number] DOB: February 13, 1961   Cancelled Treatment:    Reason Eval/Treat Not Completed: Patient at procedure or test/unavailable.  Pt currently not in room (per orders plan for paracentesis).  Will re-attempt PT eval at a later date/time.  Leitha Bleak, PT 11/26/16, 10:21 AM [phone]

## 2016-11-26 NOTE — Progress Notes (Signed)
MEDICATION RELATED CONSULT NOTE - INITIAL   Pharmacy Consult for electrolyte replacement and monitoring Indication: hypokalemia  No Known Allergies  Patient Measurements: Height: 6\' 1"  (185.4 cm) Weight: 212 lb 12.8 oz (96.5 kg) IBW/kg (Calculated) : 79.9  Vital Signs: Temp: 97.7 F (36.5 C) (04/06 1105) Temp Source: Oral (04/06 1105) BP: 103/59 (04/06 1105) Pulse Rate: 91 (04/06 1105) Intake/Output from previous day: 04/05 0701 - 04/06 0700 In: 625 [P.O.:360; IV Piggyback:265] Out: 825 [Urine:825] Intake/Output from this shift: Total I/O In: 240 [P.O.:240] Out: -   Labs:  Recent Labs  11/25/16 0955 11/25/16 2051 11/26/16 0450  WBC 4.4  --  3.5*  HGB 10.8*  --  9.6*  HCT 31.8*  --  27.5*  PLT 53*  --  45*  CREATININE 0.59* 0.60* 0.55*  MG  --   --  1.6*  PHOS  --   --  2.7  ALBUMIN 2.2*  --  2.0*  PROT 6.5  --  5.7*  AST 88*  --  73*  ALT 36  --  33  ALKPHOS 144*  --  124  BILITOT 9.3*  --  8.3*  BILIDIR 4.2*  --   --   IBILI 5.1*  --   --    Estimated Creatinine Clearance: 129.1 mL/min (A) (by C-G formula based on SCr of 0.55 mg/dL (L)).   Potassium  Date Value Ref Range Status  11/26/2016 3.7 3.5 - 5.1 mmol/L Final  05/10/2014 3.3 (L) 3.5 - 5.1 mmol/L Final      Microbiology: No results found for this or any previous visit (from the past 720 hour(s)).  Medical History: Past Medical History:  Diagnosis Date  . Bipolar 1 disorder ([place])   . Cancer ([place])   . Cirrhosis ([place])   . Hep C w/ coma, chronic ([place])   . Liver cancer ([place])   . Paranoid schizophrenia ([place])   . Suicide attempt     Medications:  Infusions:    Assessment: [name]. Patient reports noncompliance with outpatient medications including furosemide and spironolactone. Presents with ascites and anasarca due to cirrhosis and hepatocellular carcinoma, plan is to diurese. Pharmacy consulted to dose electrolytes.    Goal of Therapy:  Maintain electrolytes  WNL  Plan:  Magnesium replaced with magnesium sulfate 4 g IV once. Patient has d/c orders. Will sign off.   Ulice Dash D, Pharm.D Clinical Pharmacist 11/26/2016,12:52 PM

## 2016-11-26 NOTE — Care Management (Signed)
Called and confirmed with [facility] that patient is active.  Patient last seen 11/22/16.  Follow up appointment made 4/16 at 10:00am.  Added to discharge AVS.  Per nursing patient has been mobile, and is independent.

## 2016-11-26 NOTE — Plan of Care (Signed)
Problem: Food- and Nutrition-Related Knowledge Deficit (NB-1.1) Goal: Nutrition education Formal process to instruct or train a patient/client in a skill or to impart knowledge to help patients/clients voluntarily manage or modify food choices and eating behavior to maintain or improve health. Outcome: Adequate for Discharge Nutrition Education Note  RD consulted for nutrition education regarding low sodium diet.  Spoke with patient at bedside. He reports he is leaving the hospital today and was not very motivated to participate in RD assessment/education. Patient reports good appetite and denies any weight loss. He reports preparing two meals daily at home. He eats meat, vegetables (canned or frozen), and salad. Patient refusing NFPE, but he had his shirt off and on observation appears to have severe muscle wasting. He would benefit from assessment for malnutrition and intervention, but patient refuses at this time since he is discharging.  RD provided "Low Sodium Nutrition Therapy" handout from the Academy of [employer]etics. Reviewed patient's dietary recall. Provided examples on ways to decrease sodium intake in diet. Discouraged intake of processed foods and use of salt shaker. Encouraged fresh fruits and vegetables as well as whole grain sources of carbohydrates to maximize fiber intake.   RD discussed why it is important for patient to adhere to diet recommendations, and emphasized the role of fluids, foods to avoid, and importance of weighing self daily. Teach back method used.  Expect fair compliance.  Body mass index is 28.08 kg/m. Pt meets criteria for normal weight based on current BMI.  Current diet order is low sodium, patient is consuming approximately 100% of meals at this time. Labs and medications reviewed. No further nutrition interventions warranted at this time. RD contact information provided. If additional nutrition issues arise, please re-consult RD.   Willey Blade, MS, RD, LDN Pager: [phone] After Hours Pager: [phone]

## 2016-11-26 NOTE — Discharge Summary (Signed)
[place] [facility] [place] NAME: Aaron Jacobson    MR#:  [id number]  DATE OF BIRTH:  01-27-1961  DATE OF ADMISSION:  11/25/2016 ADMITTING PHYSICIAN: Bettey Costa, MD  DATE OF DISCHARGE: 11/26/16  PRIMARY CARE PHYSICIAN: [name]    ADMISSION DIAGNOSIS:  Shortness of breath [R06.02] Anasarca [R60.1]  DISCHARGE DIAGNOSIS:  Ascites acute on chronic s/p Paracentesis (11/26/16) Cirrhosis of liver-chronic Liver cancer  SECONDARY DIAGNOSIS:   Past Medical History:  Diagnosis Date  . Bipolar 1 disorder ([place])   . Cancer ([place])   . Cirrhosis ([place])   . Hep C w/ coma, chronic ([place])   . Liver cancer ([place])   . Paranoid schizophrenia ([place])   . Suicide attempt     HOSPITAL COURSE:   56 year old male with hepatocellular carcinoma receiving radiation and liver cirrhosis due to EtOH abuse who presents with ascites and anasarca.  1. Acute on chronic Ascites with anasarca due to underlying liver cirrhosis and hepatocellular carcinoma with noncompliance of medications: s/p paracentesis with 5 liters removed on 11/26/16 -po Lasix twice a day andContinue Aldactone Monitor BMP-stable  2. Hypokalemia: Replete and check magnesium improved  3. Hyponatremia from liver cirrhosis improving  4. Elevated LFTs, chronic with Thrombocytopenia from underlying liver cirrhosis  5.  Tobacco dependence: Patient is encouraged to quit smoking. Counseling was provided for 4 minutes.  6. Bipolar affective disorder/paranoid schizophrenia: Continue olanzapine   Patient follows with [name] on [address]. He has appt with Liver clinic at [facility] in April  D/c  home  CONSULTS OBTAINED:    DRUG ALLERGIES:  No Known Allergies  DISCHARGE MEDICATIONS:   Current Discharge Medication List    CONTINUE these medications which have CHANGED   Details  furosemide (LASIX) 40 MG tablet Take 1 tablet (40 mg total) by mouth 2 (two) times  daily. Qty: 60 tablet, Refills: 2    spironolactone (ALDACTONE) 50 MG tablet Take 1 tablet (50 mg total) by mouth daily. Qty: 30 tablet, Refills: 2      CONTINUE these medications which have NOT CHANGED   Details  carvedilol (COREG) 6.25 MG tablet Take 6.25 mg by mouth 2 (two) times daily with a meal.    ibuprofen (ADVIL,MOTRIN) 600 MG tablet Take 600 mg by mouth every 6 (six) hours as needed for moderate pain.    OLANZapine (ZYPREXA) 10 MG tablet Take 10 mg by mouth at bedtime.    omeprazole (PRILOSEC) 40 MG capsule Take 40 mg by mouth daily.         If you experience worsening of your admission symptoms, develop shortness of breath, life threatening emergency, suicidal or homicidal thoughts you must seek medical attention immediately by calling 911 or calling your MD immediately  if symptoms less severe.  You Must read complete instructions/literature along with all the possible adverse reactions/side effects for all the Medicines you take and that have been prescribed to you. Take any new Medicines after you have completely understood and accept all the possible adverse reactions/side effects.   Please note  You were cared for by a hospitalist during your hospital stay. If you have any questions about your discharge medications or the care you received while you were in the hospital after you are discharged, you can call the unit and asked to speak with the hospitalist on call if the hospitalist that took care of you is not available. Once you are discharged, your primary care physician will  handle any further medical issues. Please note that NO REFILLS for any discharge medications will be authorized once you are discharged, as it is imperative that you return to your primary care physician (or establish a relationship with a primary care physician if you do not have one) for your aftercare needs so that they can reassess your need for medications and monitor your lab values. Today    SUBJECTIVE   No new complaints  VITAL SIGNS:  Blood pressure (!) 103/59, pulse 91, temperature 97.7 F (36.5 C), temperature source Oral, resp. rate 14, height 6\' 1"  (1.854 m), weight 96.5 kg (212 lb 12.8 oz), SpO2 93 %.  I/O:    Intake/Output Summary (Last 24 hours) at 11/26/16 1211 Last data filed at 11/26/16 0900  Gross per 24 hour  Intake              865 ml  Output              825 ml  Net               40 ml    PHYSICAL EXAMINATION:  GENERAL:  56 y.o.-year-old patient lying in the bed with no acute distress.  EYES: Pupils equal, round, reactive to light and accommodation. ++ scleral icterus. Extraocular muscles intact.  HEENT: Head atraumatic, normocephalic. Oropharynx and nasopharynx clear.  NECK:  Supple, no jugular venous distention. No thyroid enlargement, no tenderness.  LUNGS: Normal breath sounds bilaterally, no wheezing, rales,rhonchi or crepitation. No use of accessory muscles of respiration.  CARDIOVASCULAR: S1, S2 normal. No murmurs, rubs, or gallops.  ABDOMEN: Soft, non-tender, distended. Bowel sounds present. No organomegaly or mass. Ascites++ EXTREMITIES: chronic +pedal edema,no cyanosis, or clubbing.  NEUROLOGIC: Cranial nerves II through XII are intact. Muscle strength 5/5 in all extremities. Sensation intact. Gait not checked.  PSYCHIATRIC: The patient is alert and oriented x 3.  SKIN: No obvious rash, lesion, or ulcer.   DATA REVIEW:   CBC   Recent Labs Lab 11/26/16 0450  WBC 3.5*  HGB 9.6*  HCT 27.5*  PLT 45*    Chemistries   Recent Labs Lab 11/26/16 0450  NA 134*  K 3.7  CL 106  CO2 23  GLUCOSE 120*  BUN 9  CREATININE 0.55*  CALCIUM 7.5*  MG 1.6*  AST 73*  ALT 33  ALKPHOS 124  BILITOT 8.3*    Microbiology Results   No results found for this or any previous visit (from the past 240 hour(s)).  RADIOLOGY:  US Paracentesis  Result Date: 11/26/2016 INDICATION: Ascites. EXAM: ULTRASOUND GUIDED therapeutic PARACENTESIS  MEDICATIONS: None. COMPLICATIONS: None immediate. PROCEDURE: Informed written consent was obtained from the patient after a discussion of the risks, benefits and alternatives to treatment. A timeout was performed prior to the initiation of the procedure. Initial ultrasound scanning demonstrates a large amount of ascites within the left lower abdominal quadrant. The right lower abdomen was prepped and draped in the usual sterile fashion. 1% lidocaine with epinephrine was used for local anesthesia. Following this, a Safe-T-Centesis catheter was introduced. An ultrasound image was saved for documentation purposes. The paracentesis was performed. The catheter was removed and a dressing was applied. The patient tolerated the procedure well without immediate post procedural complication. FINDINGS: A total of approximately 5 L of serous fluid was removed. IMPRESSION: Successful ultrasound-guided paracentesis yielding 5 liters of peritoneal fluid. Electronically Signed   By: Marijo Conception, M.D.   On: 11/26/2016 10:50   Dg Chest Port 1  View  Result Date: 11/25/2016 CLINICAL DATA:  Shortness of breath, history of lung carcinoma as well as changes of cirrhosis, lower extremity edema EXAM: PORTABLE CHEST 1 VIEW COMPARISON:  Chest x-ray of 10/04/2016 FINDINGS: The lungs are not well aerated. No pneumonia or effusion is seen. There is no evidence of congestive heart failure. Mediastinal and hilar contours are unremarkable. The heart is within normal limits in size. No bony abnormality is seen. IMPRESSION: Poor inspiration chest x-ray.  No active lung disease. Electronically Signed   By: Ivar Drape M.D.   On: 11/25/2016 10:09     Management plans discussed with the patient, family and they are in agreement.  CODE STATUS:     Code Status Orders        Start     Ordered   11/25/16 1137  Do not attempt resuscitation (DNR)  Continuous    Question Answer Comment  In the event of cardiac or respiratory ARREST Do  not call a "code blue"   In the event of cardiac or respiratory ARREST Do not perform Intubation, CPR, defibrillation or ACLS   In the event of cardiac or respiratory ARREST Use medication by any route, position, wound care, and other measures to relive pain and suffering. May use oxygen, suction and manual treatment of airway obstruction as needed for comfort.      11/25/16 1136    Code Status History    Date Active Date Inactive Code Status Order ID Comments User Context   10/04/2016  4:38 PM 10/08/2016  5:45 PM Full Code [id number]  Dustin Flock, MD ED   04/06/2016  1:44 AM 04/07/2016  7:12 PM Full Code [id number]  Harvie Bridge, DO Inpatient      TOTAL TIME TAKING CARE OF THIS PATIENT: 40 minutes.    Jayma Volpi M.D on 11/26/2016 at 12:11 PM  Between 7am to 6pm - Pager - [phone] After 6pm go to [email or web address] - password [email or web address] [place]  Office  [phone]  CC: Primary care physician; [name]

## 2016-12-03 ENCOUNTER — Encounter: Payer: Self-pay | Admitting: Emergency Medicine

## 2016-12-03 ENCOUNTER — Inpatient Hospital Stay
Admission: EM | Admit: 2016-12-03 | Discharge: 2016-12-05 | DRG: 432 | Disposition: A | Discharge status: Home / Self Care | Payer: Medicaid Other | Attending: Internal Medicine | Admitting: Internal Medicine

## 2016-12-03 ENCOUNTER — Emergency Department: Payer: Medicaid Other

## 2016-12-03 DIAGNOSIS — F319 Bipolar disorder, unspecified: Secondary | ICD-10-CM | POA: Diagnosis present

## 2016-12-03 DIAGNOSIS — Z9114 Patient's other noncompliance with medication regimen: Secondary | ICD-10-CM | POA: Diagnosis not present

## 2016-12-03 DIAGNOSIS — F200 Paranoid schizophrenia: Secondary | ICD-10-CM | POA: Diagnosis present

## 2016-12-03 DIAGNOSIS — E871 Hypo-osmolality and hyponatremia: Secondary | ICD-10-CM | POA: Diagnosis present

## 2016-12-03 DIAGNOSIS — C220 Liver cell carcinoma: Secondary | ICD-10-CM | POA: Diagnosis present

## 2016-12-03 DIAGNOSIS — K767 Hepatorenal syndrome: Secondary | ICD-10-CM | POA: Diagnosis present

## 2016-12-03 DIAGNOSIS — D649 Anemia, unspecified: Secondary | ICD-10-CM | POA: Diagnosis present

## 2016-12-03 DIAGNOSIS — E876 Hypokalemia: Secondary | ICD-10-CM | POA: Diagnosis present

## 2016-12-03 DIAGNOSIS — Z87891 Personal history of nicotine dependence: Secondary | ICD-10-CM

## 2016-12-03 DIAGNOSIS — Z79899 Other long term (current) drug therapy: Secondary | ICD-10-CM

## 2016-12-03 DIAGNOSIS — K7460 Unspecified cirrhosis of liver: Secondary | ICD-10-CM

## 2016-12-03 DIAGNOSIS — K7682 Hepatic encephalopathy: Secondary | ICD-10-CM

## 2016-12-03 DIAGNOSIS — R17 Unspecified jaundice: Secondary | ICD-10-CM

## 2016-12-03 DIAGNOSIS — B182 Chronic viral hepatitis C: Secondary | ICD-10-CM | POA: Diagnosis present

## 2016-12-03 DIAGNOSIS — R0602 Shortness of breath: Secondary | ICD-10-CM

## 2016-12-03 DIAGNOSIS — K7031 Alcoholic cirrhosis of liver with ascites: Secondary | ICD-10-CM | POA: Diagnosis present

## 2016-12-03 DIAGNOSIS — D72819 Decreased white blood cell count, unspecified: Secondary | ICD-10-CM

## 2016-12-03 DIAGNOSIS — D6959 Other secondary thrombocytopenia: Secondary | ICD-10-CM | POA: Diagnosis present

## 2016-12-03 DIAGNOSIS — Z923 Personal history of irradiation: Secondary | ICD-10-CM

## 2016-12-03 DIAGNOSIS — D696 Thrombocytopenia, unspecified: Secondary | ICD-10-CM

## 2016-12-03 DIAGNOSIS — K7290 Hepatic failure, unspecified without coma: Secondary | ICD-10-CM | POA: Diagnosis present

## 2016-12-03 DIAGNOSIS — R7401 Elevation of levels of liver transaminase levels: Secondary | ICD-10-CM

## 2016-12-03 DIAGNOSIS — Z66 Do not resuscitate: Secondary | ICD-10-CM | POA: Diagnosis present

## 2016-12-03 DIAGNOSIS — E8770 Fluid overload, unspecified: Secondary | ICD-10-CM | POA: Diagnosis present

## 2016-12-03 DIAGNOSIS — R740 Nonspecific elevation of levels of transaminase and lactic acid dehydrogenase [LDH]: Secondary | ICD-10-CM | POA: Diagnosis present

## 2016-12-03 DIAGNOSIS — R601 Generalized edema: Secondary | ICD-10-CM

## 2016-12-03 DIAGNOSIS — Z8249 Family history of ischemic heart disease and other diseases of the circulatory system: Secondary | ICD-10-CM

## 2016-12-03 LAB — CBC WITH DIFFERENTIAL/PLATELET
Basophils Absolute: 0 10*3/uL (ref 0–0.1)
Basophils Relative: 0 %
Eosinophils Absolute: 0.2 10*3/uL (ref 0–0.7)
Eosinophils Relative: 4 %
HCT: 30.5 % — ABNORMAL LOW (ref 40.0–52.0)
Hemoglobin: 10.3 g/dL — ABNORMAL LOW (ref 13.0–18.0)
Lymphocytes Relative: 10 %
Lymphs Abs: 0.5 10*3/uL — ABNORMAL LOW (ref 1.0–3.6)
MCH: 37.8 pg — ABNORMAL HIGH (ref 26.0–34.0)
MCHC: 33.9 g/dL (ref 32.0–36.0)
MCV: 111.6 fL — ABNORMAL HIGH (ref 80.0–100.0)
Monocytes Absolute: 0.9 10*3/uL (ref 0.2–1.0)
Monocytes Relative: 18 %
Neutro Abs: 3.1 10*3/uL (ref 1.4–6.5)
Neutrophils Relative %: 68 %
Platelets: 57 10*3/uL — ABNORMAL LOW (ref 150–440)
RBC: 2.73 MIL/uL — ABNORMAL LOW (ref 4.40–5.90)
RDW: 21.4 % — ABNORMAL HIGH (ref 11.5–14.5)
WBC: 4.7 10*3/uL (ref 3.8–10.6)

## 2016-12-03 LAB — BASIC METABOLIC PANEL
Anion gap: 4 — ABNORMAL LOW (ref 5–15)
BUN: 14 mg/dL (ref 6–20)
CALCIUM: 7.5 mg/dL — AB (ref 8.9–10.3)
CO2: 25 mmol/L (ref 22–32)
CREATININE: 0.3 mg/dL — AB (ref 0.61–1.24)
Chloride: 104 mmol/L (ref 101–111)
GFR calc Af Amer: >60 mL/min (ref >60)
GLUCOSE: 144 mg/dL — AB (ref 65–99)
POTASSIUM: 3.5 mmol/L (ref 3.5–5.1)
SODIUM: 133 mmol/L — AB (ref 135–145)

## 2016-12-03 LAB — HEPATIC FUNCTION PANEL
ALT: 39 U/L (ref 17–63)
AST: 87 U/L — ABNORMAL HIGH (ref 15–41)
Albumin: 1.8 g/dL — ABNORMAL LOW (ref 3.5–5.0)
Alkaline Phosphatase: 123 U/L (ref 38–126)
Bilirubin, Direct: 4.6 mg/dL — ABNORMAL HIGH (ref 0.1–0.5)
Indirect Bilirubin: 5.4 mg/dL — ABNORMAL HIGH (ref 0.3–0.9)
Total Bilirubin: 10 mg/dL — ABNORMAL HIGH (ref 0.3–1.2)
Total Protein: 5.8 g/dL — ABNORMAL LOW (ref 6.5–8.1)

## 2016-12-03 LAB — PROTEIN, PLEURAL OR PERITONEAL FLUID: Total protein, fluid: <3 g/dL

## 2016-12-03 LAB — BODY FLUID CELL COUNT WITH DIFFERENTIAL
EOS FL: 1 %
Lymphs, Fluid: 42 %
Monocyte-Macrophage-Serous Fluid: 35 %
Neutrophil Count, Fluid: 22 %
Other Cells, Fluid: 0 %
WBC FLUID: 135 uL

## 2016-12-03 LAB — MAGNESIUM: Magnesium: 1.9 mg/dL (ref 1.7–2.4)

## 2016-12-03 LAB — GLUCOSE, PLEURAL OR PERITONEAL FLUID: GLUCOSE FL: 141 mg/dL

## 2016-12-03 LAB — AMMONIA: Ammonia: 122 umol/L — ABNORMAL HIGH (ref 9–35)

## 2016-12-03 MED ORDER — LACTULOSE 10 GM/15ML PO SOLN
30.0000 g | Freq: Once | ORAL | Status: AC
  Administered 2016-12-03: 30 g via ORAL
  Filled 2016-12-03: qty 60

## 2016-12-03 MED ORDER — ALBUMIN HUMAN 25 % IV SOLN
12.5000 g | Freq: Once | INTRAVENOUS | Status: AC
  Administered 2016-12-03: 12.5 g via INTRAVENOUS
  Filled 2016-12-03: qty 50

## 2016-12-03 MED ORDER — CEFEPIME-DEXTROSE 2 GM/50ML IV SOLR
2.0000 g | Freq: Once | INTRAVENOUS | Status: AC
  Administered 2016-12-03: 2 g via INTRAVENOUS
  Filled 2016-12-03: qty 50

## 2016-12-03 MED ORDER — VANCOMYCIN HCL 10 G IV SOLR
2000.0000 mg | Freq: Once | INTRAVENOUS | Status: AC
  Administered 2016-12-03: 2000 mg via INTRAVENOUS
  Filled 2016-12-03: qty 2000

## 2016-12-03 NOTE — ED Provider Notes (Signed)
[facility] Emergency Department Provider Note    First MD Initiated Contact with Patient 12/03/16 1845     (approximate)  I have reviewed the triage vital signs and the nursing notes.   HISTORY  Chief Complaint Edema    HPI Aaron Jacobson is a 56 y.o. male with a history of liver cancer and hep C cirrhosis presents with worsening shortness of breath and abdominal distention with fluid retention in the abdomen and legs. Patient was recently admitted on the fifth of this month for similar symptoms and actually had multiple liters of fluids drained off on paracentesis. He was diuresed with improvement in symptoms. He is discharged home with follow-up with his GI specialist at [facility]. They had made a referral to hospice due to his poor prognosis and deterioration in clinical symptoms. He is not established with hospice yet. Returns today for worsening shortness of breath. No fevers. No nausea or vomiting. No diarrhea.   Past Medical History:  Diagnosis Date  . Bipolar 1 disorder ([place])   . Cancer ([place])   . Cirrhosis ([place])   . Hep C w/ coma, chronic ([place])   . Liver cancer ([place])   . Paranoid schizophrenia ([place])   . Suicide attempt    Family History  Problem Relation Age of Onset  . Hypertension Mother    Past Surgical History:  Procedure Laterality Date  . ABDOMINAL SURGERY     Patient Active Problem List   Diagnosis Date Noted  . Ascites 11/25/2016  . Anasarca 10/04/2016  . Hypotension 04/06/2016  . Suicide attempt by beta blocker overdose ([place]) 04/06/2016  . Depression 04/06/2016  . Liver cancer ([place]) 04/06/2016  . Bipolar 1 disorder ([place]) 04/06/2016  . Hepatitis C 04/06/2016  . Paranoid schizophrenia ([place]) 04/06/2016  . Bradycardia   . Prolonged Q-T interval on ECG       Prior to Admission medications   Medication Sig Start Date End Date Taking? Authorizing Provider  carvedilol (COREG) 6.25 MG tablet Take 6.25 mg by mouth 2 (two)  times daily with a meal.    Historical Provider, MD  furosemide (LASIX) 40 MG tablet Take 1 tablet (40 mg total) by mouth 2 (two) times daily. 11/26/16   Fritzi Mandes, MD  ibuprofen (ADVIL,MOTRIN) 600 MG tablet Take 600 mg by mouth every 6 (six) hours as needed for moderate pain.    Historical Provider, MD  OLANZapine (ZYPREXA) 10 MG tablet Take 10 mg by mouth at bedtime.    Historical Provider, MD  omeprazole (PRILOSEC) 40 MG capsule Take 40 mg by mouth daily.     Historical Provider, MD  spironolactone (ALDACTONE) 50 MG tablet Take 1 tablet (50 mg total) by mouth daily. 11/26/16   Fritzi Mandes, MD    Allergies Patient has no known allergies.    Social History Social History  Substance Use Topics  . Smoking status: Former Smoker    Packs/day: 0.10    Types: Cigarettes    Quit date: 11/18/2016  . Smokeless tobacco: Never Used  . Alcohol use No    Review of Systems Patient denies headaches, rhinorrhea, blurry vision, numbness, shortness of breath, chest pain, edema, cough, abdominal pain, nausea, vomiting, diarrhea, dysuria, fevers, rashes or hallucinations unless otherwise stated above in HPI. ____________________________________________   PHYSICAL EXAM:  VITAL SIGNS: Vitals:   12/03/16 2000 12/03/16 2030  BP: 102/84 113/75  Pulse: 88   Resp: (!) 24 (!) 24  Temp:      Constitutional: Alert  jaundiced, ill appearing but in no acute distress. Eyes: scleral icteris. PERRL. EOMI. Head: Atraumatic. Nose: No congestion/rhinnorhea. Mouth/Throat: Mucous membranes are moist.  Oropharynx non-erythematous. Neck: No stridor. Painless ROM. No cervical spine tenderness to palpation Hematological/Lymphatic/Immunilogical: No cervical lymphadenopathy. Cardiovascular: Normal rate, regular rhythm. Grossly normal heart sounds.  Good peripheral circulation. Respiratory: Normal respiratory effort.  No retractions. Lungs with coarse bibasilar breathsounds and non productive cough Gastrointestinal:  tense with + fluid wave, distension. No peritonitis Musculoskeletal: No lower extremity tenderness, Bilateral 2+  edema.  No joint effusions. Neurologic:  Normal speech and language. No facial droop Skin:  Skin is warm, dry and intact. No rash noted. Psychiatric: Mood and affect are normal. Speech and behavior are normal.  ____________________________________________   LABS (all labs ordered are listed, but only abnormal results are displayed)  Results for orders placed or performed during the hospital encounter of 12/03/16 (from the past 24 hour(s))  Basic metabolic panel     Status: Abnormal   Collection Time: 12/03/16  7:43 PM  Result Value Ref Range   Sodium 133 (L) 135 - 145 mmol/L   Potassium 3.5 3.5 - 5.1 mmol/L   Chloride 104 101 - 111 mmol/L   CO2 25 22 - 32 mmol/L   Glucose, Bld 144 (H) 65 - 99 mg/dL   BUN 14 6 - 20 mg/dL   Creatinine, Ser 0.30 (L) 0.61 - 1.24 mg/dL   Calcium 7.5 (L) 8.9 - 10.3 mg/dL   GFR calc non Af Amer >60 >60 mL/min   GFR calc Af Amer >60 >60 mL/min   Anion gap 4 (L) 5 - 15  Hepatic function panel     Status: Abnormal   Collection Time: 12/03/16  7:43 PM  Result Value Ref Range   Total Protein 5.8 (L) 6.5 - 8.1 g/dL   Albumin 1.8 (L) 3.5 - 5.0 g/dL   AST 87 (H) 15 - 41 U/L   ALT 39 17 - 63 U/L   Alkaline Phosphatase 123 38 - 126 U/L   Total Bilirubin 10.0 (H) 0.3 - 1.2 mg/dL   Bilirubin, Direct 4.6 (H) 0.1 - 0.5 mg/dL   Indirect Bilirubin 5.4 (H) 0.3 - 0.9 mg/dL  CBC with Differential/Platelet     Status: Abnormal   Collection Time: 12/03/16  7:43 PM  Result Value Ref Range   WBC 4.7 3.8 - 10.6 K/uL   RBC 2.73 (L) 4.40 - 5.90 MIL/uL   Hemoglobin 10.3 (L) 13.0 - 18.0 g/dL   HCT 30.5 (L) 40.0 - 52.0 %   MCV 111.6 (H) 80.0 - 100.0 fL   MCH 37.8 (H) 26.0 - 34.0 pg   MCHC 33.9 32.0 - 36.0 g/dL   RDW 21.4 (H) 11.5 - 14.5 %   Platelets 57 (L) 150 - 440 K/uL   Neutrophils Relative % 68 %   Neutro Abs 3.1 1.4 - 6.5 K/uL   Lymphocytes Relative  10 %   Lymphs Abs 0.5 (L) 1.0 - 3.6 K/uL   Monocytes Relative 18 %   Monocytes Absolute 0.9 0.2 - 1.0 K/uL   Eosinophils Relative 4 %   Eosinophils Absolute 0.2 0 - 0.7 K/uL   Basophils Relative 0 %   Basophils Absolute 0.0 0 - 0.1 K/uL  Ammonia     Status: Abnormal   Collection Time: 12/03/16  7:44 PM  Result Value Ref Range   Ammonia 122 (H) 9 - 35 umol/L  Magnesium     Status: None   Collection Time: 12/03/16  7:45 PM  Result Value Ref Range   Magnesium 1.9 1.7 - 2.4 mg/dL   ____________________________________________  EKG My review and personal interpretation at Time: 18:58   Indication: sob  Rate: 85  Rhythm: sinus Axis: normal Other: no st elevations, prolonged QT ____________________________________________  RADIOLOGY  I personally reviewed all radiographic images ordered to evaluate for the above acute complaints and reviewed radiology reports and findings.  These findings were personally discussed with the patient.  Please see medical record for radiology report.  ____________________________________________   PROCEDURES  Procedure(s) performed:  ABDOMINAL PARACENTESIS Date/Time: 12/03/2016 8:58 PM Performed by: Merlyn Lot Authorized by: Merlyn Lot  Consent: Verbal consent obtained. Consent given by: patient Patient understanding: patient states understanding of the procedure being performed Required items: required blood products, implants, devices, and special equipment available Patient identity confirmed: verbally with patient Time out: Immediately prior to procedure a "time out" was called to verify the correct patient, procedure, equipment, support staff and site/side marked as required. Preparation: Patient was prepped and draped in the usual sterile fashion. Local anesthesia used: yes Anesthesia: local infiltration  Anesthesia: Local anesthesia used: yes Local Anesthetic: lidocaine 1% without epinephrine Anesthetic total: 5  mL  Sedation: Patient sedated: no Patient tolerance: Patient tolerated the procedure well with no immediate complications       Critical Care performed: no ____________________________________________   INITIAL IMPRESSION / ASSESSMENT AND PLAN / ED COURSE  Pertinent labs & imaging results that were available during my care of the patient were reviewed by me and considered in my medical decision making (see chart for details).  DDX: hepatic encephalopathy, cirrhosis, anasarca, dehydration, hypoalbuminemia, chf, copd  Yoltzin Barg is a 56 y.o. who presents to the ED with Shortness of breath and diffusely distended Abdomen.  Patient otherwise afebrile but very ill-appearing. He is also jaundiced with significant scleral icterus. Blood work shows worsening of his liver function. Patient profoundly anasarca with albumin level is decreasing. Also with steadily increasing hyperbilirubinemia. Due to his dyspnea most likely secondary to intra-abdominal compression from his diffuse ascites paracentesis was performed and sent for cultures. Patient was significant improvement after paracentesis. Based on his confusion and no evidence of hepatic encephalopathy the patient was given lactulose in the ER. Patient alert and enough to understand the very poor prognosis of his condition and is requesting help with hospice.  Cx-ray shows concerning consolidation and given his multiple presentations to the hospital with shortness of breath and immunocompromised state we'll give dose of antibiotics after obtaining cultures.      ____________________________________________   FINAL CLINICAL IMPRESSION(S) / ED DIAGNOSES  Final diagnoses:  Anasarca  Hepatorenal failure ([place])  Hepatic encephalopathy ([facility])  Shortness of breath      NEW MEDICATIONS STARTED DURING THIS VISIT:  New Prescriptions   No medications on file     Note:  This document was prepared using Dragon voice recognition software  and may include unintentional dictation errors.    Merlyn Lot, MD 12/03/16 2100
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
## 2016-12-03 NOTE — ED Notes (Signed)
Spoke with Chrys Racer in pharmacy regarding Albumin. States they will get it ready as soon as possible.

## 2016-12-03 NOTE — ED Triage Notes (Signed)
Pt arrived via EMS from home for reports of fluid retention in abdomen and legs. Pt has history of liver cancer. Pt states had a lot of fluid drawn off eight days ago here. EMS reports VSS. Denies pain.

## 2016-12-04 ENCOUNTER — Inpatient Hospital Stay: Payer: Medicaid Other

## 2016-12-04 LAB — COMPREHENSIVE METABOLIC PANEL
ALBUMIN: 2 g/dL — AB (ref 3.5–5.0)
ALK PHOS: 113 U/L (ref 38–126)
ALT: 36 U/L (ref 17–63)
ALT: 35 U/L (ref 17–63)
ANION GAP: 4 — AB (ref 5–15)
AST: 76 U/L — AB (ref 15–41)
AST: 77 U/L — ABNORMAL HIGH (ref 15–41)
Albumin: 1.9 g/dL — ABNORMAL LOW (ref 3.5–5.0)
Alkaline Phosphatase: 118 U/L (ref 38–126)
Anion gap: 5 (ref 5–15)
BILIRUBIN TOTAL: 9.5 mg/dL — AB (ref 0.3–1.2)
BILIRUBIN TOTAL: 10 mg/dL — AB (ref 0.3–1.2)
BUN: 13 mg/dL (ref 6–20)
BUN: 14 mg/dL (ref 6–20)
CALCIUM: 7.8 mg/dL — AB (ref 8.9–10.3)
CALCIUM: 7.6 mg/dL — AB (ref 8.9–10.3)
CO2: 25 mmol/L (ref 22–32)
CO2: 24 mmol/L (ref 22–32)
Chloride: 107 mmol/L (ref 101–111)
Chloride: 107 mmol/L (ref 101–111)
Creatinine, Ser: 0.5 mg/dL — ABNORMAL LOW (ref 0.61–1.24)
Creatinine, Ser: 0.47 mg/dL — ABNORMAL LOW (ref 0.61–1.24)
GFR calc Af Amer: >60 mL/min (ref >60)
GFR calc non Af Amer: >60 mL/min (ref >60)
GLUCOSE: 108 mg/dL — AB (ref 65–99)
Glucose, Bld: 124 mg/dL — ABNORMAL HIGH (ref 65–99)
POTASSIUM: 3.6 mmol/L (ref 3.5–5.1)
Potassium: 3.2 mmol/L — ABNORMAL LOW (ref 3.5–5.1)
Sodium: 137 mmol/L (ref 135–145)
Sodium: 135 mmol/L (ref 135–145)
TOTAL PROTEIN: 6 g/dL — AB (ref 6.5–8.1)
Total Protein: 5.8 g/dL — ABNORMAL LOW (ref 6.5–8.1)

## 2016-12-04 LAB — CBC
HCT: 30.3 % — ABNORMAL LOW (ref 40.0–52.0)
Hemoglobin: 10.4 g/dL — ABNORMAL LOW (ref 13.0–18.0)
MCH: 38.7 pg — ABNORMAL HIGH (ref 26.0–34.0)
MCHC: 34.3 g/dL (ref 32.0–36.0)
MCV: 112.7 fL — ABNORMAL HIGH (ref 80.0–100.0)
PLATELETS: 56 10*3/uL — AB (ref 150–440)
RBC: 2.69 MIL/uL — ABNORMAL LOW (ref 4.40–5.90)
RDW: 20.8 % — AB (ref 11.5–14.5)
WBC: 4.5 10*3/uL (ref 3.8–10.6)

## 2016-12-04 LAB — MRSA PCR SCREENING: MRSA by PCR: NEGATIVE

## 2016-12-04 LAB — MAGNESIUM: MAGNESIUM: 1.9 mg/dL (ref 1.7–2.4)

## 2016-12-04 LAB — PHOSPHORUS: PHOSPHORUS: 3.7 mg/dL (ref 2.5–4.6)

## 2016-12-04 MED ORDER — VANCOMYCIN HCL IN DEXTROSE 1-5 GM/200ML-% IV SOLN
1000.0000 mg | Freq: Three times a day (TID) | INTRAVENOUS | Status: DC
  Filled 2016-12-04: qty 200

## 2016-12-04 MED ORDER — SODIUM CHLORIDE 0.9% FLUSH
3.0000 mL | Freq: Two times a day (BID) | INTRAVENOUS | Status: DC
  Administered 2016-12-04: 3 mL via INTRAVENOUS

## 2016-12-04 MED ORDER — IPRATROPIUM BROMIDE 0.02 % IN SOLN: 0.5000 mg | Freq: Four times a day (QID) | RESPIRATORY_TRACT | Status: DC | PRN

## 2016-12-04 MED ORDER — FUROSEMIDE 10 MG/ML IJ SOLN
40.0000 mg | Freq: Two times a day (BID) | INTRAMUSCULAR | Status: DC
  Administered 2016-12-04: 40 mg via INTRAVENOUS
  Filled 2016-12-04: qty 4

## 2016-12-04 MED ORDER — POTASSIUM CHLORIDE CRYS ER 20 MEQ PO TBCR
40.0000 meq | EXTENDED_RELEASE_TABLET | Freq: Two times a day (BID) | ORAL | Status: DC
  Administered 2016-12-04 – 2016-12-05 (×3): 40 meq via ORAL
  Filled 2016-12-04 (×3): qty 2

## 2016-12-04 MED ORDER — OXYCODONE HCL 5 MG PO TABS
5.0000 mg | ORAL_TABLET | ORAL | Status: DC | PRN
  Administered 2016-12-04: 5 mg via ORAL
  Filled 2016-12-04: qty 1

## 2016-12-04 MED ORDER — SODIUM CHLORIDE 0.9 % IV SOLN: 250.0000 mL | INTRAVENOUS | Status: DC | PRN

## 2016-12-04 MED ORDER — VANCOMYCIN HCL 10 G IV SOLR
2000.0000 mg | Freq: Once | INTRAVENOUS | Status: DC
  Filled 2016-12-04: qty 2000

## 2016-12-04 MED ORDER — SODIUM CHLORIDE 0.9% FLUSH: 3.0000 mL | INTRAVENOUS | Status: DC | PRN

## 2016-12-04 MED ORDER — RIFAXIMIN 550 MG PO TABS
550.0000 mg | ORAL_TABLET | Freq: Two times a day (BID) | ORAL | Status: DC
  Administered 2016-12-04 – 2016-12-05 (×3): 550 mg via ORAL
  Filled 2016-12-04 (×3): qty 1

## 2016-12-04 MED ORDER — FUROSEMIDE 40 MG PO TABS: 40.0000 mg | ORAL_TABLET | Freq: Two times a day (BID) | ORAL | Status: DC

## 2016-12-04 MED ORDER — LACTULOSE 10 GM/15ML PO SOLN
30.0000 g | Freq: Two times a day (BID) | ORAL | Status: DC
  Administered 2016-12-04 – 2016-12-05 (×4): 30 g via ORAL
  Filled 2016-12-04 (×4): qty 60

## 2016-12-04 MED ORDER — VANCOMYCIN HCL 10 G IV SOLR
1250.0000 mg | Freq: Three times a day (TID) | INTRAVENOUS | Status: DC
  Administered 2016-12-04: 08:00:00 1250 mg via INTRAVENOUS
  Filled 2016-12-04 (×3): qty 1250

## 2016-12-04 MED ORDER — LEVOFLOXACIN 750 MG PO TABS
750.0000 mg | ORAL_TABLET | Freq: Every day | ORAL | Status: DC
  Administered 2016-12-04: 750 mg via ORAL
  Filled 2016-12-04: qty 1

## 2016-12-04 MED ORDER — DEXTROSE 5 % IV SOLN
2.0000 g | Freq: Three times a day (TID) | INTRAVENOUS | Status: DC
  Administered 2016-12-04: 2 g via INTRAVENOUS
  Filled 2016-12-04 (×3): qty 2

## 2016-12-04 MED ORDER — PANTOPRAZOLE SODIUM 40 MG PO TBEC
40.0000 mg | DELAYED_RELEASE_TABLET | Freq: Every day | ORAL | Status: DC
  Administered 2016-12-04 – 2016-12-05 (×2): 40 mg via ORAL
  Filled 2016-12-04 (×2): qty 1

## 2016-12-04 MED ORDER — SPIRONOLACTONE 25 MG PO TABS
100.0000 mg | ORAL_TABLET | Freq: Two times a day (BID) | ORAL | Status: DC
  Administered 2016-12-04 – 2016-12-05 (×4): 100 mg via ORAL
  Filled 2016-12-04 (×4): qty 4

## 2016-12-04 MED ORDER — CARVEDILOL 6.25 MG PO TABS
6.2500 mg | ORAL_TABLET | Freq: Two times a day (BID) | ORAL | Status: DC
  Administered 2016-12-04 – 2016-12-05 (×3): 6.25 mg via ORAL
  Filled 2016-12-04 (×3): qty 1

## 2016-12-04 MED ORDER — FUROSEMIDE 40 MG PO TABS
40.0000 mg | ORAL_TABLET | Freq: Two times a day (BID) | ORAL | Status: DC
  Administered 2016-12-04: 40 mg via ORAL
  Filled 2016-12-04: qty 1

## 2016-12-04 MED ORDER — OLANZAPINE 10 MG PO TABS
10.0000 mg | ORAL_TABLET | Freq: Every day | ORAL | Status: DC
  Administered 2016-12-04 (×2): 10 mg via ORAL
  Filled 2016-12-04 (×3): qty 1

## 2016-12-04 NOTE — Progress Notes (Addendum)
Pharmacy Antibiotic Note  Aaron Jacobson is a 56 y.o. male admitted on 12/03/2016 with pneumonia.  Pharmacy has been consulted for vancomcyin dosing.  Plan: Patient was given vanc 2g IV x 1 in ED  Will follow up w/ vanc 1.25g q8h and check a VT 4/15 @ 0600 prior to 4th dose. Ke 0.11 t1/2 6 hours ~ 8 hours trial first SS trough: 12.9 mcg/mL  Goal trough 15 - 20 mcg/mL Will continue to monitor renal function and adjust as necessary.  Will start cefepime 2g IV q8h   Height: 6\' 1"  (185.4 cm) Weight: 208 lb (94.3 kg) IBW/kg (Calculated) : 79.9  Temp (24hrs), Avg:98.1 F (36.7 C), Min:98 F (36.7 C), Max:98.2 F (36.8 C)   Recent Labs Lab 12/03/16 1943  WBC 4.7  CREATININE 0.30*    Estimated Creatinine Clearance: 119.3 mL/min (A) (by C-G formula based on SCr of 0.3 mg/dL (L)).    No Known Allergies  Thank you for allowing pharmacy to be a part of this patient's care.  Tobie Lords, PharmD, BCPS Clinical Pharmacist 12/04/2016

## 2016-12-04 NOTE — Evaluation (Signed)
Physical Therapy Evaluation Patient Details Name: Aaron Jacobson MRN: [id number] DOB: [birth year] Today's Date: 12/04/2016   History of Present Illness  Pt is a 56 y/o M who presented to the ED for evaluation of swelling in abdomen and LE with SOB.  Pt was admitted on 11/25/16 with ascites/anasarca and had fluid drained via paracentesis on 4/5.  Pt's PMH includes hepatitis C, bipolar disorder, cirrhosis, liver cancer receiving radiation, paranoid schizophrenia.     Clinical Impression  Pt admitted with above diagnosis. Mr. Finigan was Ind PTA aside from driving as he does not have a license.  He does not demonstrate any instability with high level challenges to balance while ambulating.  He is independent at this time with all aspects of mobility.  No skilled PT needs identified.  PT will sign off.     Follow Up Recommendations No PT follow up    Equipment Recommendations  None recommended by PT    Recommendations for Other Services       Precautions / Restrictions Precautions Precautions: None Restrictions Weight Bearing Restrictions: No      Mobility  Bed Mobility               General bed mobility comments: Pt sitting EOB at start of session  Transfers Overall transfer level: Independent Equipment used: None             General transfer comment: No cues or physical assist needed  Ambulation/Gait Ambulation/Gait assistance: Independent Ambulation Distance (Feet): 200 Feet Assistive device: None Gait Pattern/deviations: WFL(Within Functional Limits)   Gait velocity interpretation: at or above normal speed for age/gender General Gait Details: Pt steady even with challenges to balance.  Independent.  Stairs            Wheelchair Mobility    Modified Rankin (Stroke Patients Only)       Balance Overall balance assessment: Independent                           High level balance activites: Direction changes;Turns;Sudden stops;Head turns High  Level Balance Comments: No instability noted with high level balance activities             Pertinent Vitals/Pain Pain Assessment: No/denies pain    Home Living Family/patient expects to be discharged to:: Private residence Living Arrangements: Alone Available Help at Discharge: Family;Available PRN/intermittently Type of Home: Apartment Home Access: Level entry     Home Layout: One level Home Equipment: None      Prior Function Level of Independence: Independent         Comments: Denies any falls in the past 6 months.  Sister does the driving as pt does not have a driver's license.  Pt independent with all ADLs, IADLs, and mobility.     Hand Dominance        Extremity/Trunk Assessment   Upper Extremity Assessment Upper Extremity Assessment: Overall WFL for tasks assessed    Lower Extremity Assessment Lower Extremity Assessment: RLE deficits/detail;LLE deficits/detail RLE Deficits / Details: Swelling BLE but strength WNL LLE Deficits / Details: Swelling BLE but strength WNL       Communication   Communication: No difficulties  Cognition Arousal/Alertness: Awake/alert Behavior During Therapy: WFL for tasks assessed/performed Overall Cognitive Status: Within Functional Limits for tasks assessed  General Comments      Exercises     Assessment/Plan    PT Assessment Patent does not need any further PT services  PT Problem List         PT Treatment Interventions      PT Goals (Current goals can be found in the Care Plan section)  Acute Rehab PT Goals Patient Stated Goal: to go home PT Goal Formulation: All assessment and education complete, DC therapy    Frequency     Barriers to discharge        Co-evaluation               End of Session Equipment Utilized During Treatment: Gait belt Activity Tolerance: Patient tolerated treatment well Patient left: in bed;with call bell/phone  within reach;Other (comment) (sitting EOB finishing breakfast) Nurse Communication: Mobility status PT Visit Diagnosis: Unsteadiness on feet (R26.81)    Time: 3818-2993 PT Time Calculation (min) (ACUTE ONLY): 16 min   Charges:   PT Evaluation $PT Eval Low Complexity: 1 Procedure     PT G CodesCollie Siad PT, DPT 12/04/2016, 10:26 AM

## 2016-12-04 NOTE — Plan of Care (Signed)
Problem: Education: Goal: Knowledge of [facility] Education information/materials will improve Outcome: Progressing Pt likes to be called Aaron Jacobson  Past Medical History:  Diagnosis Date  . Bipolar 1 disorder ([place])   . Cancer ([place])   . Cirrhosis ([place])   . Hep C w/ coma, chronic ([place])   . Liver cancer ([place])   . Paranoid schizophrenia ([place])   . Suicide attempt    Pt is well controlled with home medications
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
## 2016-12-04 NOTE — Progress Notes (Signed)
Pharmacy Antibiotic Note  Aaron Jacobson is a 56 y.o. male admitted on 12/03/2016 with pneumonia.  Pharmacy has been consulted for Levaquin dosing.  Plan: Patient has lost IV access and does not want a PICC line placed.  Change current antibiiotics to Levofloxacin Oral.  Based on renal function will order 750mg  q24hr.  Height: 6\' 1"  (185.4 cm) Weight: 208 lb (94.3 kg) IBW/kg (Calculated) : 79.9  Temp (24hrs), Avg:97.9 F (36.6 C), Min:97.5 F (36.4 C), Max:98.2 F (36.8 C)   Recent Labs Lab 12/03/16 1943 12/04/16 0530  WBC 4.7 4.5  CREATININE 0.30* 0.50*    Estimated Creatinine Clearance: 119.3 mL/min (A) (by C-G formula based on SCr of 0.5 mg/dL (L)).    No Known Allergies  Thank you for allowing pharmacy to be a part of this patient's care.  Theordore Cisnero K 12/04/2016 2:01 PM

## 2016-12-05 DIAGNOSIS — R740 Nonspecific elevation of levels of transaminase and lactic acid dehydrogenase [LDH]: Secondary | ICD-10-CM

## 2016-12-05 DIAGNOSIS — R7401 Elevation of levels of liver transaminase levels: Secondary | ICD-10-CM

## 2016-12-05 DIAGNOSIS — R17 Unspecified jaundice: Secondary | ICD-10-CM

## 2016-12-05 DIAGNOSIS — C220 Liver cell carcinoma: Secondary | ICD-10-CM

## 2016-12-05 DIAGNOSIS — B182 Chronic viral hepatitis C: Secondary | ICD-10-CM

## 2016-12-05 DIAGNOSIS — D72819 Decreased white blood cell count, unspecified: Secondary | ICD-10-CM

## 2016-12-05 DIAGNOSIS — D696 Thrombocytopenia, unspecified: Secondary | ICD-10-CM

## 2016-12-05 DIAGNOSIS — K7460 Unspecified cirrhosis of liver: Secondary | ICD-10-CM

## 2016-12-05 LAB — URINALYSIS, COMPLETE (UACMP) WITH MICROSCOPIC
BACTERIA UA: NONE SEEN
Glucose, UA: NEGATIVE mg/dL
Hgb urine dipstick: NEGATIVE
Ketones, ur: 5 mg/dL — AB
Leukocytes, UA: NEGATIVE
NITRITE: NEGATIVE
PROTEIN: NEGATIVE mg/dL
RBC / HPF: NONE SEEN RBC/hpf (ref 0–5)
SQUAMOUS EPITHELIAL / LPF: NONE SEEN
Specific Gravity, Urine: 1.029 (ref 1.005–1.030)
pH: 5 (ref 5.0–8.0)

## 2016-12-05 LAB — CBC
HCT: 29.3 % — ABNORMAL LOW (ref 40.0–52.0)
HEMOGLOBIN: 10.1 g/dL — AB (ref 13.0–18.0)
MCH: 37.8 pg — AB (ref 26.0–34.0)
MCHC: 34.5 g/dL (ref 32.0–36.0)
MCV: 109.5 fL — AB (ref 80.0–100.0)
PLATELETS: 50 10*3/uL — AB (ref 150–440)
RBC: 2.68 MIL/uL — AB (ref 4.40–5.90)
RDW: 20.1 % — ABNORMAL HIGH (ref 11.5–14.5)
WBC: 2.8 10*3/uL — ABNORMAL LOW (ref 3.8–10.6)

## 2016-12-05 LAB — C DIFFICILE QUICK SCREEN W PCR REFLEX
C Diff antigen: NEGATIVE
C Diff interpretation: NOT DETECTED
C Diff toxin: NEGATIVE

## 2016-12-05 LAB — BASIC METABOLIC PANEL
ANION GAP: 6 (ref 5–15)
BUN: 13 mg/dL (ref 6–20)
CHLORIDE: 103 mmol/L (ref 101–111)
CO2: 22 mmol/L (ref 22–32)
CREATININE: 0.44 mg/dL — AB (ref 0.61–1.24)
Calcium: 7.9 mg/dL — ABNORMAL LOW (ref 8.9–10.3)
GFR calc non Af Amer: >60 mL/min (ref >60)
Glucose, Bld: 109 mg/dL — ABNORMAL HIGH (ref 65–99)
POTASSIUM: 4.2 mmol/L (ref 3.5–5.1)
Sodium: 131 mmol/L — ABNORMAL LOW (ref 135–145)

## 2016-12-05 LAB — PROTIME-INR
INR: 1.6
PROTHROMBIN TIME: 19.2 s — AB (ref 11.4–15.2)

## 2016-12-05 LAB — MISC LABCORP TEST (SEND OUT): Labcorp test code: 19588

## 2016-12-05 MED ORDER — LEVOFLOXACIN 750 MG PO TABS: 750.0000 mg | ORAL_TABLET | Freq: Every day | ORAL | 0 refills | Status: AC

## 2016-12-05 MED ORDER — LACTULOSE 10 GM/15ML PO SOLN: 30.0000 g | Freq: Two times a day (BID) | ORAL | 0 refills | Status: AC

## 2016-12-05 MED ORDER — FUROSEMIDE 40 MG PO TABS
40.0000 mg | ORAL_TABLET | Freq: Every day | ORAL | Status: DC
  Administered 2016-12-05: 40 mg via ORAL
  Filled 2016-12-05: qty 1

## 2016-12-05 MED ORDER — RIFAXIMIN 550 MG PO TABS: 550.0000 mg | ORAL_TABLET | Freq: Two times a day (BID) | ORAL | 3 refills | Status: AC

## 2016-12-05 MED ORDER — SPIRONOLACTONE 100 MG PO TABS: 100.0000 mg | ORAL_TABLET | Freq: Two times a day (BID) | ORAL | 3 refills | Status: AC

## 2016-12-05 MED ORDER — SPIRONOLACTONE 100 MG PO TABS: 100.0000 mg | ORAL_TABLET | Freq: Two times a day (BID) | ORAL | 3 refills | Status: DC

## 2016-12-05 MED ORDER — LACTULOSE 10 GM/15ML PO SOLN: 30.0000 g | Freq: Two times a day (BID) | ORAL | 0 refills | Status: DC

## 2016-12-05 MED ORDER — RIFAXIMIN 550 MG PO TABS
550.0000 mg | ORAL_TABLET | Freq: Two times a day (BID) | ORAL | 3 refills | Status: DC
Start: 2016-12-05 — End: 2016-12-05

## 2016-12-05 MED ORDER — LEVOFLOXACIN 750 MG PO TABS: 750.0000 mg | ORAL_TABLET | Freq: Every day | ORAL | 0 refills | Status: DC
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
## 2016-12-05 NOTE — Progress Notes (Signed)
Pt is being discharged home. Palliative care will f/u with pt per [name], case manager. Pt is aware. [facility] papers given and explained to pt. Pt verbalized understanding. RX given. No f/u appointments at this time. Awaiting transportation.

## 2016-12-05 NOTE — Care Management Note (Signed)
Case Management Note  Patient Details  Name: Aaron Jacobson MRN: [id number] Date of Birth: Dec 23, 1960  Subjective/Objective:   Note in comments section of Dr Keenan Bachelor discharge order for Palliative Care to F/U with [name] at his home. Lorelle Formosa at [facility] reports that they already have a referral for Hospice services for [name]. [facility] has a referral and will follow up with [name] in his home.                 Action/Plan:   Expected Discharge Date:  12/05/16               Expected Discharge Plan:     In-House Referral:     Discharge planning Services     Post Acute Care Choice:    Choice offered to:     DME Arranged:    DME Agency:     HH Arranged:    HH Agency:     Status of Service:     If discussed at [employer] [facility] [employer], dates discussed:    Additional Comments:  [name] A, RN 12/05/2016, 12:12 PM

## 2016-12-06 LAB — PATHOLOGIST SMEAR REVIEW

## 2016-12-07 LAB — BODY FLUID CULTURE: CULTURE: NO GROWTH

## 2016-12-08 LAB — CULTURE, BLOOD (ROUTINE X 2)
Culture: NO GROWTH
Culture: NO GROWTH

## 2016-12-21 DEATH — deceased

## 2017-10-03 IMAGING — CT CT ABD-PELV W/ CM
2 of 5 series · 15 of 46 positions shown, 17 images · IV contrast (APPLIED)
Comparison: None.

CLINICAL DATA: Worsening abdominal distention and scrotal swelling.

EXAM:
CT ABDOMEN AND PELVIS WITH CONTRAST
TECHNIQUE: Multidetector CT imaging of the abdomen and pelvis was performed
using the standard protocol following bolus administration of
intravenous contrast.
CONTRAST:  100mL L1SNGP-2CC IOPAMIDOL (L1SNGP-2CC) INJECTION 61%

[Series 2: routine abd/pel with · axial · 0.79mm/px · z∈[-361,+144]mm · 12 of 115 slices shown, 14 images]
[im 7/115  soft-tissue]
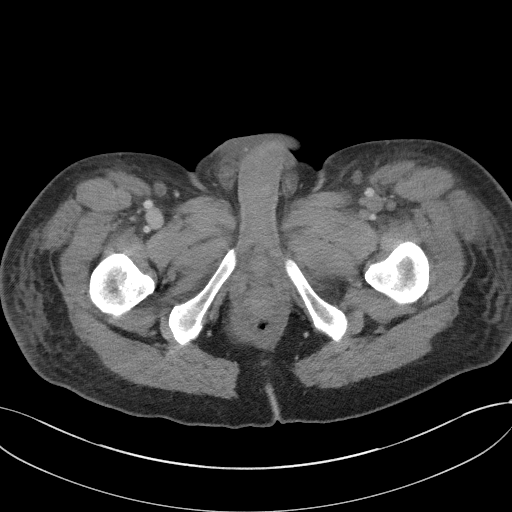
[im 7/115  bone]
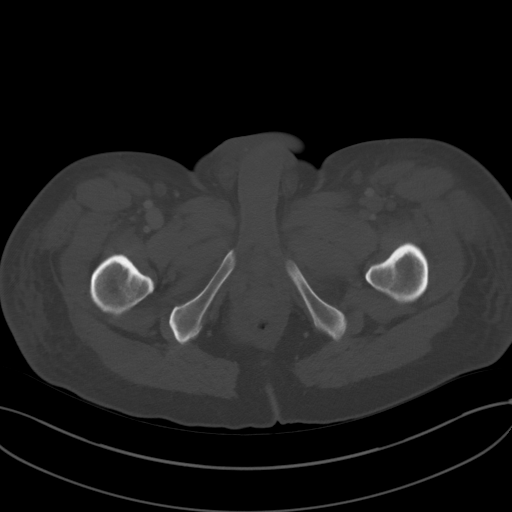
[im 21/115  soft-tissue]
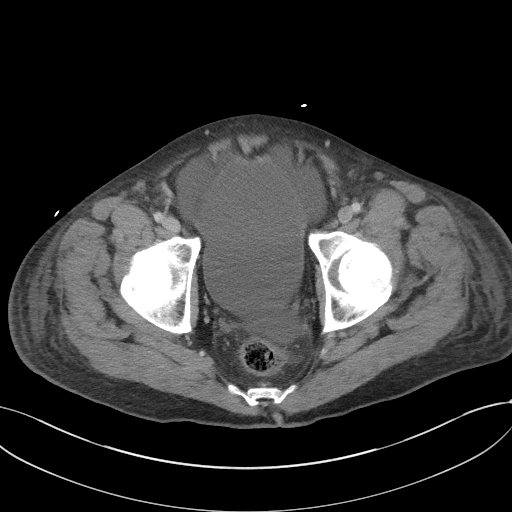
[im 27/115  soft-tissue]
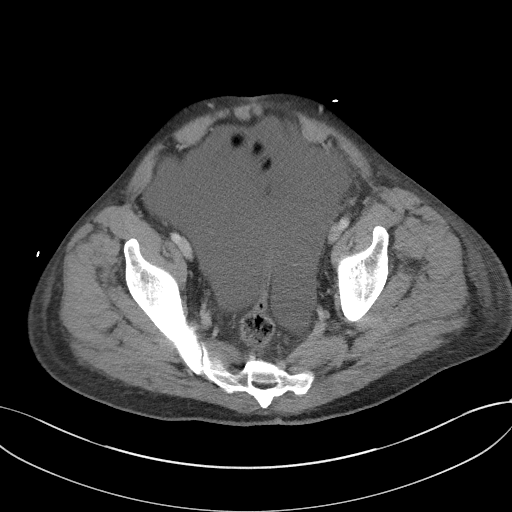
[im 34/115  soft-tissue]
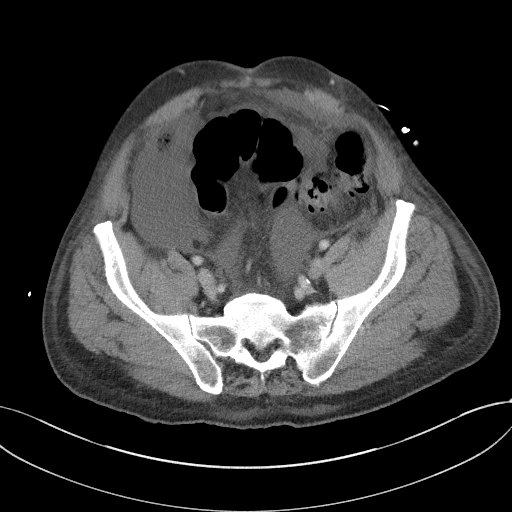
[im 47/115  soft-tissue]
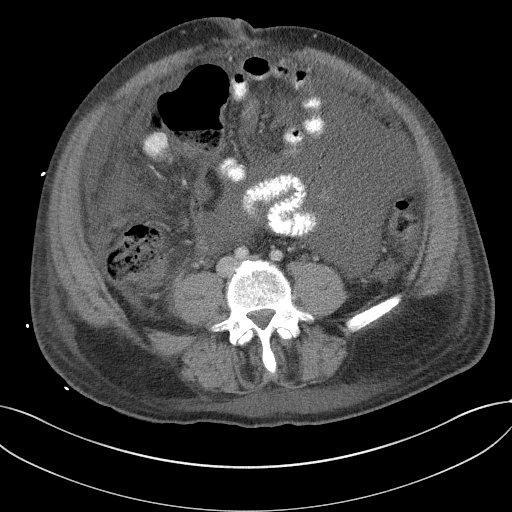
[im 54/115  soft-tissue]
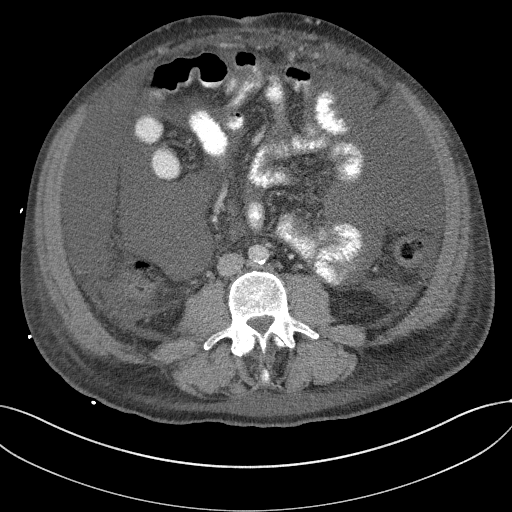
[im 61/115  soft-tissue]
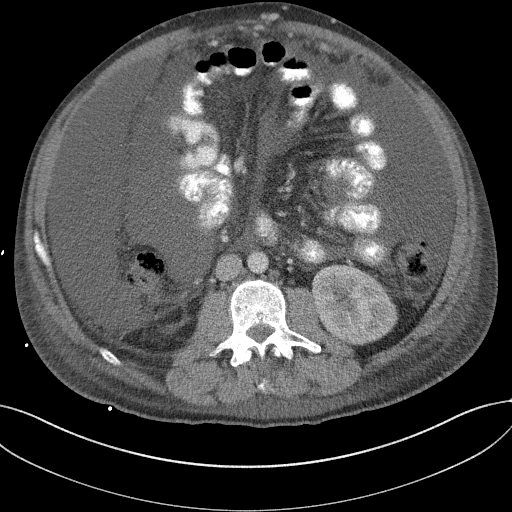
[im 74/115  soft-tissue]
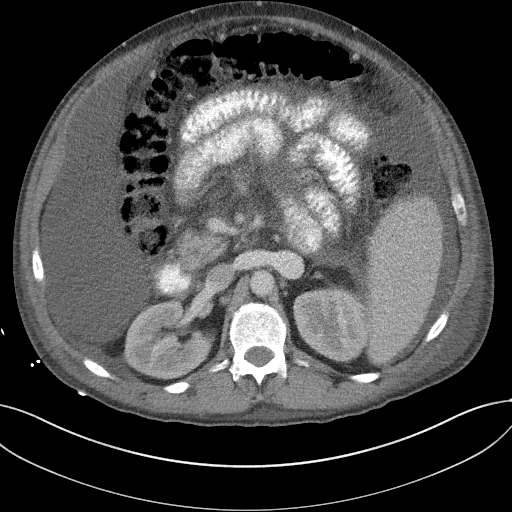
[im 81/115  soft-tissue]
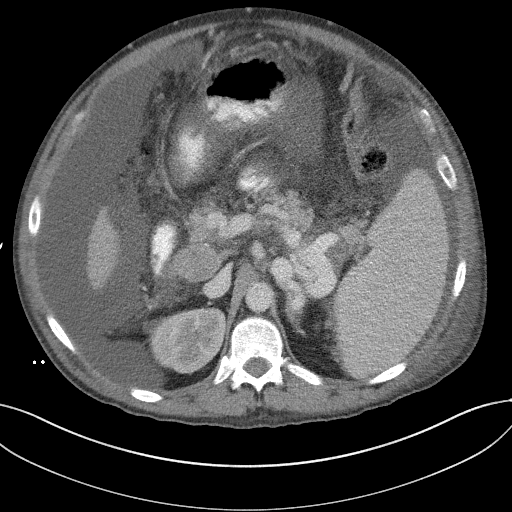
[im 81/115  bone]
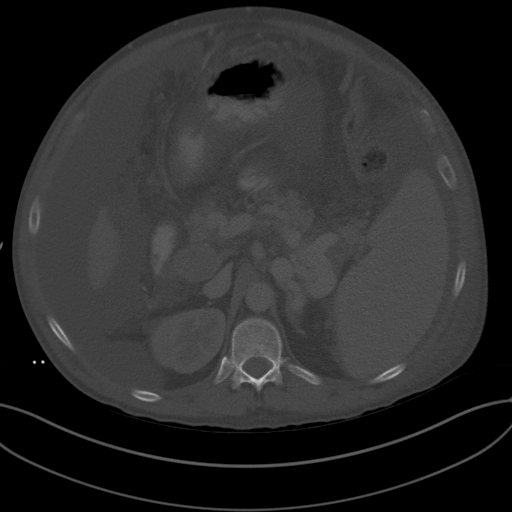
[im 88/115  soft-tissue]
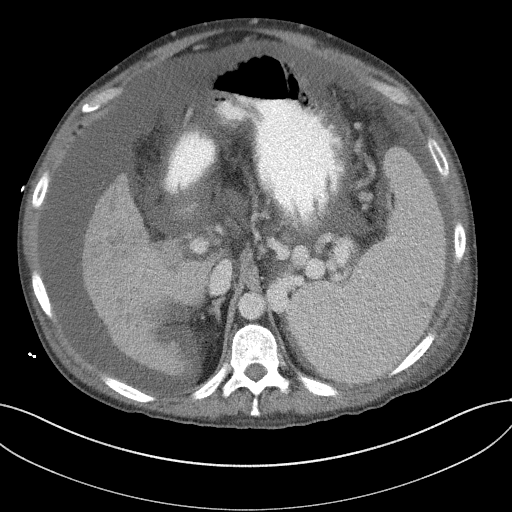
[im 101/115  soft-tissue]
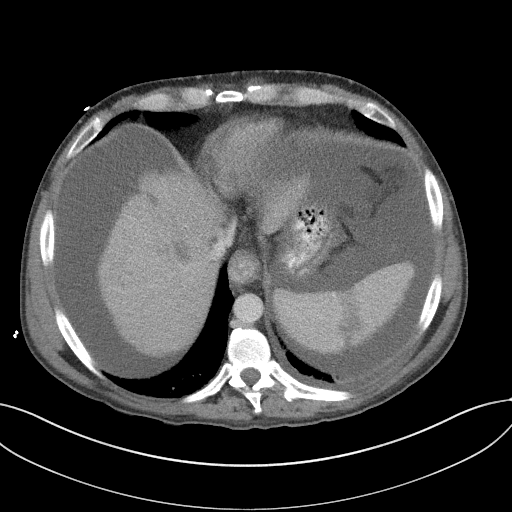
[im 108/115  soft-tissue]
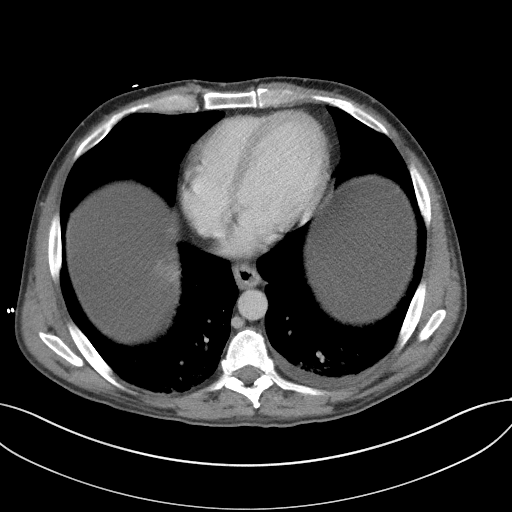

[Series 5: coronal st · coronal · 0.78mm/px · 3 of 109 slices shown]
[im 37/109  soft-tissue]
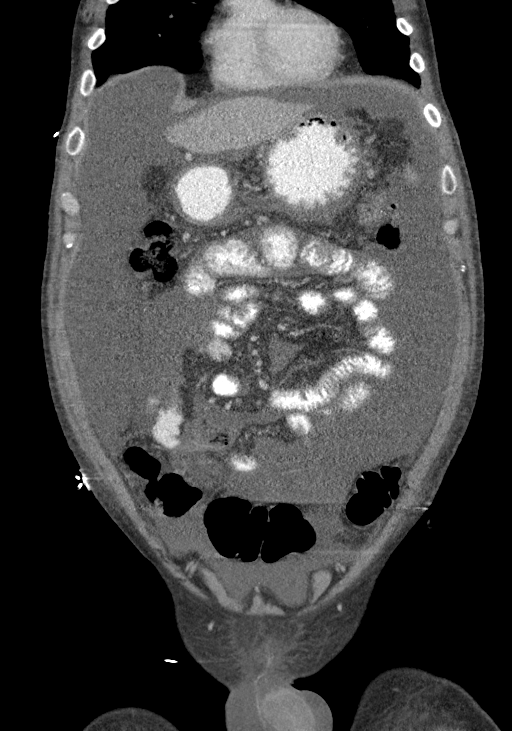
[im 49/109  soft-tissue]
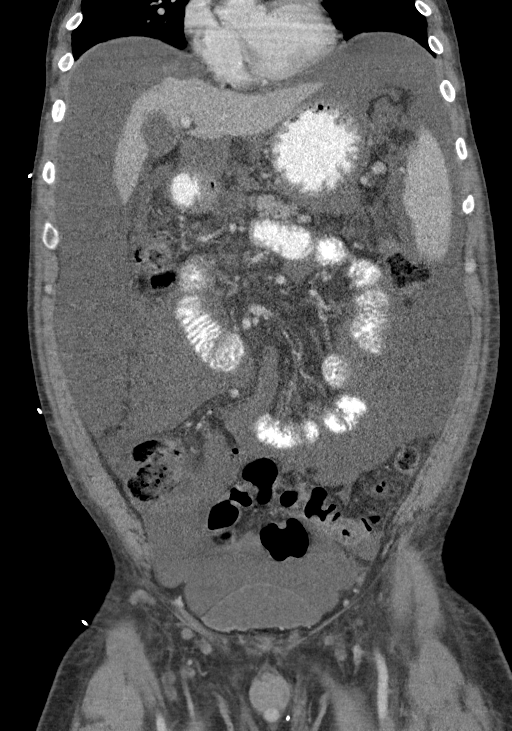
[im 61/109  soft-tissue]
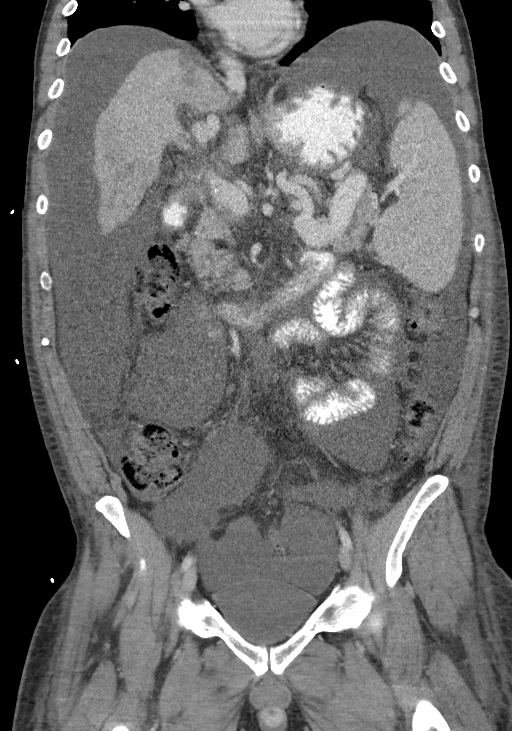

[15 of 46 positions shown; findings below may reference images not displayed]

FINDINGS: Lower chest: There is a small left pleural effusion identified. No
pericardial effusion.

Hepatobiliary: The liver appears cirrhotic. There appears to be
thrombosis of the portal vein branches to the right lobe of liver.
Ill defined areas of low attenuation within the right lobe of liver
are identified. Index lesion measures approximately 4.3 x 4.7 cm,
image 24 of series 2. Along the dome of liver there is a focal area
of low attenuation measuring 5 cm, image number 10 of series 7.
Gallbladder is unremarkable.

Pancreas: Unremarkable. No pancreatic ductal dilatation or
surrounding inflammatory changes.

Spleen: The spleen is enlarged measuring 18.1 cm in length. There is
a wedge-shaped area of low attenuation along the dome of the spleen,
image 83 of series 5. This is suspicious for splenic infarct.

Adrenals/Urinary Tract: Unremarkable appearance of the adrenal
glands. No focal kidney abnormality identified. There is no
hydronephrosis or mass. The urinary bladder is unremarkable.

Stomach/Bowel: The stomach is normal. No pathologic dilatation of
the small or large bowel loops.

Vascular/Lymphatic: Aortic atherosclerosis. Upper abdominal
varicosities are identified including a large splenorenal shunt.
Large perigastric varices are identified, image 24 of series 2. Re-
cannulization of the umbilical vein noted. No upper abdominal or
pelvic adenopathy. No inguinal adenopathy. There is no upper
abdominal or pelvic adenopathy.

Reproductive: The prostate gland is unremarkable.

Other: There is a marked amount of abdominal and pelvic ascites. No
focal fluid collections identified.

Musculoskeletal: Degenerative disc disease noted within the lumbar
spine.
IMPRESSION: 1. Morphologic feature the liver compatible with cirrhosis. There is
stigmata of portal venous hypertension including splenomegaly,
ascites and upper abdominal varicosities.
2. The ill defined liver lesions compatible with suspected
multifocal hepatocellular carcinoma are difficult to assess with
arterial phase imaging. Consider further investigation with liver
protocol MRI for more definitive assessment of extent of
hepatocellular carcinoma.
3. There is thrombosis of the portal vein to the right lobe of liver
which may reflect bland thrombosis versus tumor thrombus.
4. Splenic infarct.
5. Aortic atherosclerosis.
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
